# Patient Record
Sex: Female | Born: 1960 | Race: White | Hispanic: No | Marital: Married | Smoking: Former smoker
Health system: Southern US, Community
[De-identification: ages and names within clinical notes are randomized; demographics above are authoritative.]

## PROBLEM LIST (undated history)

## (undated) DIAGNOSIS — I10 Essential (primary) hypertension: Secondary | ICD-10-CM

## (undated) DIAGNOSIS — E119 Type 2 diabetes mellitus without complications: Secondary | ICD-10-CM

## (undated) DIAGNOSIS — E785 Hyperlipidemia, unspecified: Secondary | ICD-10-CM

## (undated) DIAGNOSIS — O00109 Unspecified tubal pregnancy without intrauterine pregnancy: Secondary | ICD-10-CM

## (undated) DIAGNOSIS — F419 Anxiety disorder, unspecified: Secondary | ICD-10-CM

## (undated) DIAGNOSIS — N840 Polyp of corpus uteri: Secondary | ICD-10-CM

## (undated) DIAGNOSIS — N920 Excessive and frequent menstruation with regular cycle: Secondary | ICD-10-CM

## (undated) HISTORY — DX: Anxiety disorder, unspecified: F41.9

## (undated) HISTORY — DX: Hyperlipidemia, unspecified: E78.5

## (undated) HISTORY — DX: Unspecified tubal pregnancy without intrauterine pregnancy: O00.109

## (undated) HISTORY — PX: OTHER SURGICAL HISTORY: SHX169

## (undated) HISTORY — DX: Polyp of corpus uteri: N84.0

## (undated) HISTORY — DX: Essential (primary) hypertension: I10

## (undated) HISTORY — DX: Excessive and frequent menstruation with regular cycle: N92.0

## (undated) HISTORY — DX: Type 2 diabetes mellitus without complications: E11.9

## (undated) HISTORY — PX: DILATION AND CURETTAGE OF UTERUS: SHX78

---

## 1998-02-26 ENCOUNTER — Ambulatory Visit (HOSPITAL_COMMUNITY): Admission: RE | Admit: 1998-02-26 | Discharge: 1998-02-26 | Payer: Self-pay | Admitting: Obstetrics and Gynecology

## 1998-11-20 ENCOUNTER — Encounter: Payer: Self-pay | Admitting: Internal Medicine

## 1998-11-20 ENCOUNTER — Ambulatory Visit (HOSPITAL_COMMUNITY): Admission: RE | Admit: 1998-11-20 | Discharge: 1998-11-20 | Payer: Self-pay | Admitting: Internal Medicine

## 1999-07-10 ENCOUNTER — Other Ambulatory Visit: Admission: RE | Admit: 1999-07-10 | Discharge: 1999-07-10 | Payer: Self-pay | Admitting: Obstetrics and Gynecology

## 2000-12-04 ENCOUNTER — Encounter (INDEPENDENT_AMBULATORY_CARE_PROVIDER_SITE_OTHER): Payer: Self-pay

## 2000-12-04 ENCOUNTER — Other Ambulatory Visit
Admission: RE | Admit: 2000-12-04 | Discharge: 2000-12-04 | Payer: Self-pay | Admitting: Physical Medicine & Rehabilitation

## 2001-04-07 ENCOUNTER — Encounter: Payer: Self-pay | Admitting: Obstetrics and Gynecology

## 2001-04-07 ENCOUNTER — Encounter: Admission: RE | Admit: 2001-04-07 | Discharge: 2001-04-07 | Payer: Self-pay | Admitting: Obstetrics and Gynecology

## 2001-04-28 ENCOUNTER — Encounter: Admission: RE | Admit: 2001-04-28 | Discharge: 2001-04-28 | Payer: Self-pay | Admitting: Obstetrics and Gynecology

## 2001-04-28 ENCOUNTER — Encounter: Payer: Self-pay | Admitting: Obstetrics and Gynecology

## 2001-05-11 ENCOUNTER — Encounter: Admission: RE | Admit: 2001-05-11 | Discharge: 2001-05-11 | Payer: Self-pay | Admitting: Internal Medicine

## 2001-05-11 ENCOUNTER — Encounter: Payer: Self-pay | Admitting: Internal Medicine

## 2001-05-26 ENCOUNTER — Ambulatory Visit (HOSPITAL_COMMUNITY): Admission: RE | Admit: 2001-05-26 | Discharge: 2001-05-26 | Payer: Self-pay | Admitting: Obstetrics and Gynecology

## 2001-05-26 ENCOUNTER — Encounter (INDEPENDENT_AMBULATORY_CARE_PROVIDER_SITE_OTHER): Payer: Self-pay

## 2001-08-05 ENCOUNTER — Encounter: Admission: RE | Admit: 2001-08-05 | Discharge: 2001-08-05 | Payer: Self-pay | Admitting: Obstetrics and Gynecology

## 2001-08-05 ENCOUNTER — Encounter: Payer: Self-pay | Admitting: Obstetrics and Gynecology

## 2001-11-02 ENCOUNTER — Encounter: Payer: Self-pay | Admitting: Obstetrics and Gynecology

## 2001-11-02 ENCOUNTER — Encounter: Admission: RE | Admit: 2001-11-02 | Discharge: 2001-11-02 | Payer: Self-pay | Admitting: Obstetrics and Gynecology

## 2001-11-03 ENCOUNTER — Encounter: Admission: RE | Admit: 2001-11-03 | Discharge: 2001-11-03 | Payer: Self-pay | Admitting: Internal Medicine

## 2001-11-03 ENCOUNTER — Encounter: Payer: Self-pay | Admitting: Internal Medicine

## 2001-11-16 ENCOUNTER — Encounter: Admission: RE | Admit: 2001-11-16 | Discharge: 2001-11-16 | Payer: Self-pay | Admitting: Internal Medicine

## 2001-11-16 ENCOUNTER — Encounter: Payer: Self-pay | Admitting: Internal Medicine

## 2001-11-24 ENCOUNTER — Emergency Department (HOSPITAL_COMMUNITY): Admission: EM | Admit: 2001-11-24 | Discharge: 2001-11-25 | Payer: Self-pay | Admitting: *Deleted

## 2001-11-29 ENCOUNTER — Ambulatory Visit (HOSPITAL_COMMUNITY): Admission: RE | Admit: 2001-11-29 | Discharge: 2001-11-29 | Payer: Self-pay | Admitting: Obstetrics and Gynecology

## 2001-11-29 ENCOUNTER — Encounter: Payer: Self-pay | Admitting: Obstetrics and Gynecology

## 2002-01-03 ENCOUNTER — Encounter: Payer: Self-pay | Admitting: Obstetrics and Gynecology

## 2002-01-03 ENCOUNTER — Encounter: Admission: RE | Admit: 2002-01-03 | Discharge: 2002-01-03 | Payer: Self-pay | Admitting: Obstetrics and Gynecology

## 2002-10-18 ENCOUNTER — Other Ambulatory Visit: Admission: RE | Admit: 2002-10-18 | Discharge: 2002-10-18 | Payer: Self-pay | Admitting: Obstetrics and Gynecology

## 2002-11-08 ENCOUNTER — Ambulatory Visit (HOSPITAL_BASED_OUTPATIENT_CLINIC_OR_DEPARTMENT_OTHER): Admission: RE | Admit: 2002-11-08 | Discharge: 2002-11-08 | Payer: Self-pay | Admitting: Obstetrics and Gynecology

## 2002-11-08 ENCOUNTER — Encounter (INDEPENDENT_AMBULATORY_CARE_PROVIDER_SITE_OTHER): Payer: Self-pay

## 2002-12-01 ENCOUNTER — Encounter: Payer: Self-pay | Admitting: Obstetrics and Gynecology

## 2002-12-01 ENCOUNTER — Encounter: Admission: RE | Admit: 2002-12-01 | Discharge: 2002-12-01 | Payer: Self-pay | Admitting: Obstetrics and Gynecology

## 2003-04-27 ENCOUNTER — Encounter: Payer: Self-pay | Admitting: Internal Medicine

## 2003-04-27 ENCOUNTER — Encounter: Admission: RE | Admit: 2003-04-27 | Discharge: 2003-04-27 | Payer: Self-pay | Admitting: Internal Medicine

## 2004-04-09 ENCOUNTER — Other Ambulatory Visit: Admission: RE | Admit: 2004-04-09 | Discharge: 2004-04-09 | Payer: Self-pay | Admitting: *Deleted

## 2004-04-11 ENCOUNTER — Encounter: Admission: RE | Admit: 2004-04-11 | Discharge: 2004-04-11 | Payer: Self-pay | Admitting: Obstetrics and Gynecology

## 2004-05-16 ENCOUNTER — Encounter: Admission: RE | Admit: 2004-05-16 | Discharge: 2004-05-16 | Payer: Self-pay | Admitting: Internal Medicine

## 2005-06-20 ENCOUNTER — Encounter: Admission: RE | Admit: 2005-06-20 | Discharge: 2005-06-20 | Payer: Self-pay | Admitting: Internal Medicine

## 2005-07-17 ENCOUNTER — Ambulatory Visit (HOSPITAL_COMMUNITY): Admission: RE | Admit: 2005-07-17 | Discharge: 2005-07-17 | Payer: Self-pay | Admitting: Gastroenterology

## 2005-07-24 ENCOUNTER — Other Ambulatory Visit: Admission: RE | Admit: 2005-07-24 | Discharge: 2005-07-24 | Payer: Self-pay | Admitting: Obstetrics and Gynecology

## 2005-07-30 ENCOUNTER — Encounter: Admission: RE | Admit: 2005-07-30 | Discharge: 2005-07-30 | Payer: Self-pay | Admitting: Internal Medicine

## 2005-10-07 ENCOUNTER — Encounter: Admission: RE | Admit: 2005-10-07 | Discharge: 2005-10-07 | Payer: Self-pay | Admitting: Gastroenterology

## 2006-04-04 HISTORY — PX: LAPAROSCOPIC CHOLECYSTECTOMY: SUR755

## 2006-04-21 ENCOUNTER — Ambulatory Visit (HOSPITAL_COMMUNITY): Admission: RE | Admit: 2006-04-21 | Discharge: 2006-04-21 | Payer: Self-pay | Admitting: Surgery

## 2006-04-21 ENCOUNTER — Encounter (INDEPENDENT_AMBULATORY_CARE_PROVIDER_SITE_OTHER): Payer: Self-pay | Admitting: *Deleted

## 2006-08-19 ENCOUNTER — Encounter: Admission: RE | Admit: 2006-08-19 | Discharge: 2006-08-19 | Payer: Self-pay | Admitting: Internal Medicine

## 2006-09-01 ENCOUNTER — Encounter: Admission: RE | Admit: 2006-09-01 | Discharge: 2006-09-01 | Payer: Self-pay | Admitting: Internal Medicine

## 2006-09-30 ENCOUNTER — Other Ambulatory Visit: Admission: RE | Admit: 2006-09-30 | Discharge: 2006-09-30 | Payer: Self-pay | Admitting: Obstetrics & Gynecology

## 2007-01-01 IMAGING — RF DG CHOLANGIOGRAM OPERATIVE
1 series · 4 of 4 positions shown · non-contrast
Comparison: none

CLINICAL DATA: Biliary dyskinesia.  
 OPERATIVE CHOLANGIOGRAM:
 50 C-arm fluoroscopic guided spot images.  Contrast injection of the cystic duct led to excellent opacification of the biliary ducts.  No biliary ductal stricture or dilatation.  Vague oval filling defect is initially noted in the common hepatic duct on image #28 and that is noted to move superiorly into the main left hepatic duct.  I feel that this represents an air bubble.  No findings to strongly suggest ductal stones.

[Series 1: run · 4 of 50 frames shown]
[frame 2/50]
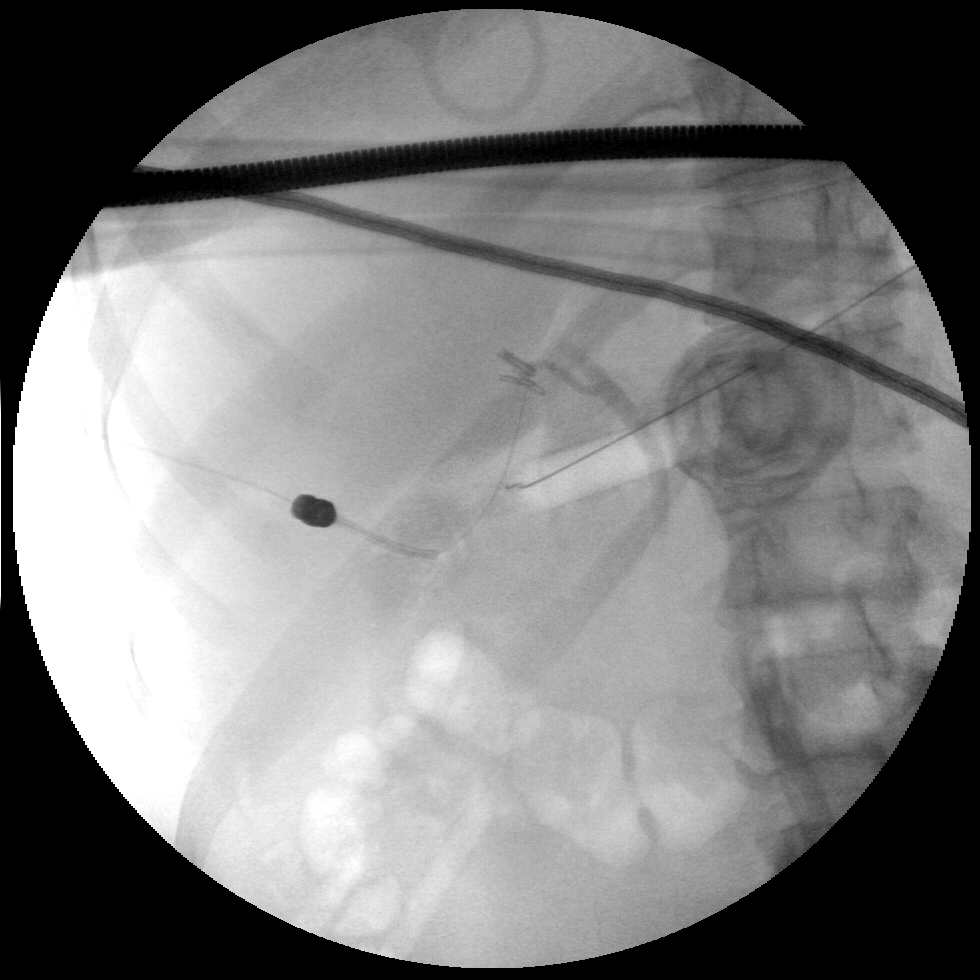
[frame 8/50]
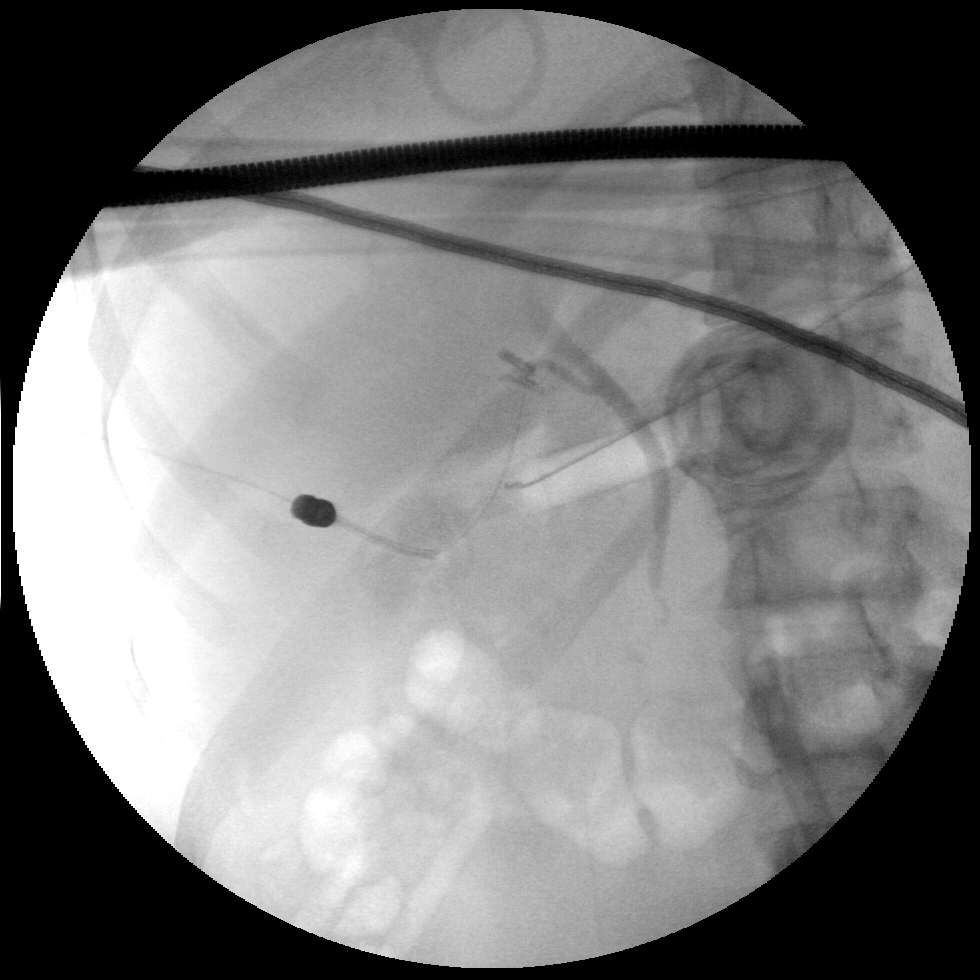
[frame 26/50]
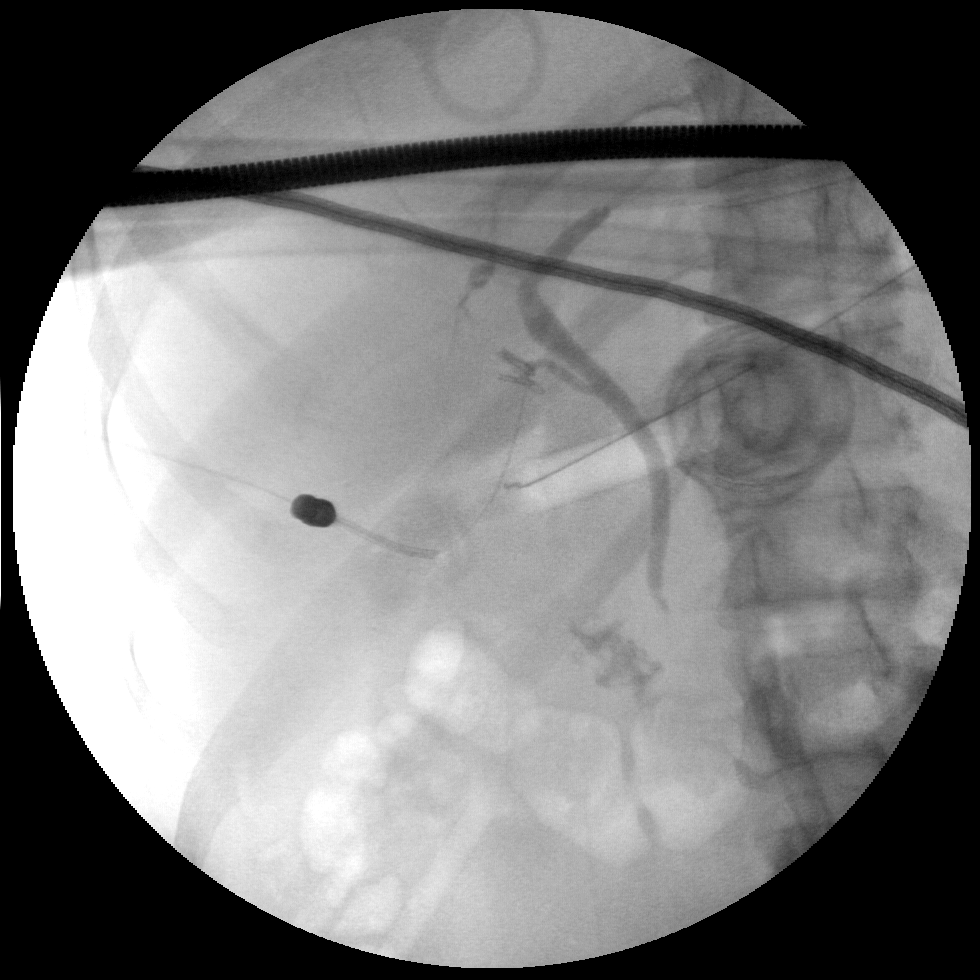
[frame 43/50]
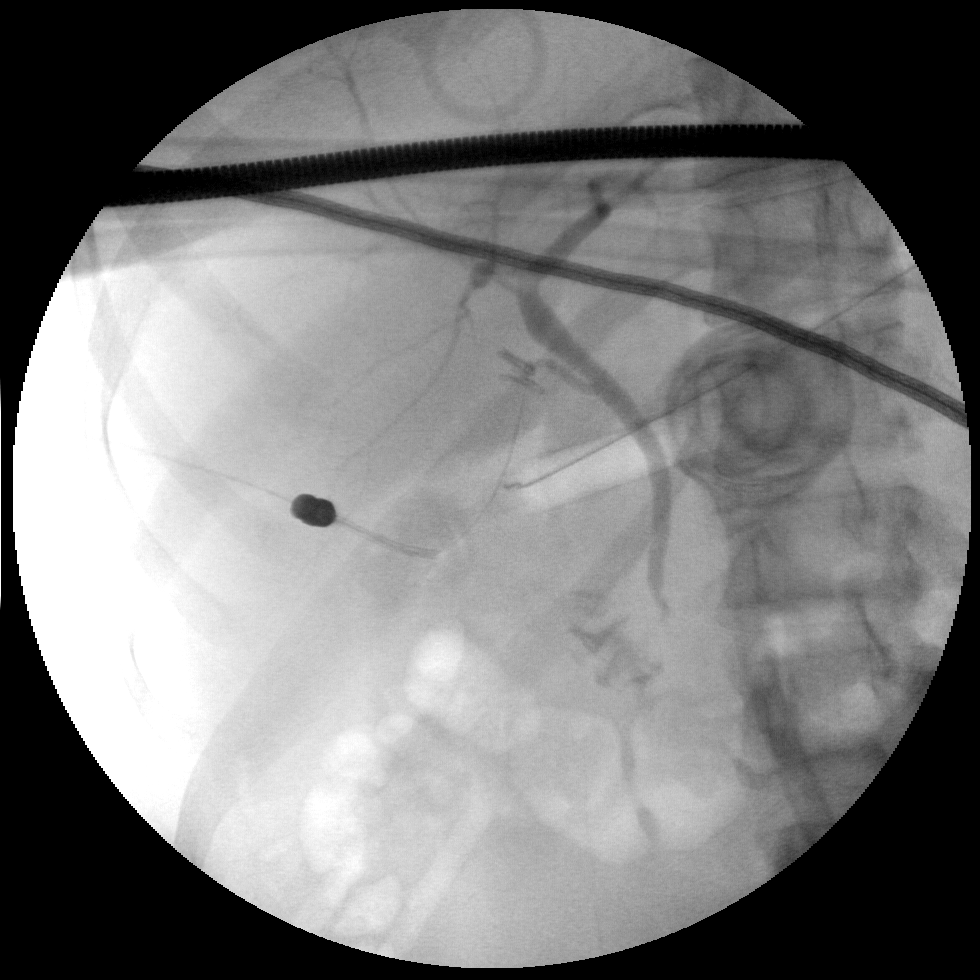

[4 of 4 positions shown; findings below may reference images not displayed]

IMPRESSION: Negative operative cholangiogram.

## 2007-09-06 ENCOUNTER — Encounter: Admission: RE | Admit: 2007-09-06 | Discharge: 2007-09-06 | Payer: Self-pay | Admitting: Internal Medicine

## 2007-11-03 ENCOUNTER — Other Ambulatory Visit: Admission: RE | Admit: 2007-11-03 | Discharge: 2007-11-03 | Payer: Self-pay | Admitting: Obstetrics and Gynecology

## 2008-10-03 ENCOUNTER — Encounter: Admission: RE | Admit: 2008-10-03 | Discharge: 2008-10-03 | Payer: Self-pay | Admitting: Internal Medicine

## 2010-01-28 ENCOUNTER — Encounter: Admission: RE | Admit: 2010-01-28 | Discharge: 2010-01-28 | Payer: Self-pay | Admitting: Internal Medicine

## 2010-08-25 ENCOUNTER — Encounter: Payer: Self-pay | Admitting: Internal Medicine

## 2010-12-20 NOTE — H&P (Signed)
[facility]  Patient:    Tina [name], Tina [name] Visit Number: [id number] MRN: [id number]          Service Type: DSU Location: [facility] Attending Physician:  Madelyn Flavors Dictated by:   Beather Arbour Thomasena Edis, M.D. Admit Date:  05/26/2001   CC:         [facility], [place], [place] [address]   History and Physical  ATTENDING NOT GIVEN  HISTORY OF PRESENT ILLNESS:   The patient is a 50 year old Caucasian female who presented complaining of menorrhagia with large clots in mid July.  She subsequently underwent a sonohysterogram but was having her menstrual period and we were unable to ascertain with certainty the presence or absence of an endometrial polyp.  She subsequently underwent a repeat sonohysterogram which did show an endometrial polyp.  She was counseled to undergo D&C hysteroscopy. Risks of surgery including anesthetic complication, hemorrhage, infection, damage to adjacent structures including bladder, bowel, blood vessels, or ureter were discussed with the patient.  It should be noted that the polyp was noted to be in the posterior fundal region.  The patient was also made aware of the risk of uterine perforation, which could result in overwhelming life-threatening hemorrhage requiring emergent hysterectomy or uterine perforation resulting in bowel damage, which could require emergent colostomy or result in overwhemling life-threatening peritonitis.  She expressed understanding of and acceptance of these risks, and thus she is admitted for [facility] hysteroscopy and polypectomy.  PAST MEDICAL HISTORY:         The patient underwent a D&E for a missed abortion in July 1999.  Depression and history of hypertension.  The patient has a history of an ectopic pregnancy in 1993.  MEDICATIONS:                  Diovan 160 mg daily.  FAMILY HISTORY:               There is no family history of colon, breast, ovarian, or prostate  cancer.  ALLERGIES:                    No known drug allergies.  SOCIAL HISTORY:               The patient works for [employer] as a Occupational psychologist.  REVIEW OF SYSTEMS:            Denies chest pain, shortness of breath, dizziness, scotomata, abdominal pain, unintentional weight loss, hematochezia, dysuria, urgency, or frequency.  PHYSICAL EXAMINATION:  VITAL SIGNS:                  Blood pressure 142/90, weight 172 pounds, temperature 97.2.  LMP May 04, 2001.  HEENT:                        Normal.  NECK:                         Supple without thyromegaly.  LUNGS:                        Clear to auscultation.  CARDIAC:                      Regular rate and rhythm.  ABDOMEN:  Soft, nontender.  No hepatosplenomegaly.  PELVIC:                       BUS normal.  Vagina normal.  Pap smear performed October 05, 2000 was within normal limits.  Uterus in the position normal mobile without any adnexal mass palpated.  Rectal is confirmatory.  ASSESSMENT AND PLAN:          The patient is a 50 year old Caucasian female, gravida 2, para 0 with a possible posterior endometrial polyp and a history of significant menorrhagia admitted for a dilation and curettage hysteroscopy and polypectomy.  Risks have been explained to the patient and she expresses understanding of and acceptance of these risks. Dictated by:   Beather Arbour Thomasena Edis, M.D. Attending Physician:  Madelyn Flavors DD:  05/26/01 TD:  05/26/01 Job: [id number]

## 2010-12-20 NOTE — Op Note (Signed)
Tina [name], Tina [name]                            ACCOUNT NO.:  [id number]   MEDICAL RECORD NO.:  [id number]                   PATIENT TYPE:  AMB   LOCATION:  [facility]                                 FACILITY:  [facility]   PHYSICIAN:  Laqueta Linden, M.D.                 DATE OF BIRTH:  1961/07/08   DATE OF PROCEDURE:  11/08/2002  DATE OF DISCHARGE:                                 OPERATIVE REPORT   PREOPERATIVE DIAGNOSES:  Abnormal uterine bleeding due to endometrial polyp.   POSTOPERATIVE DIAGNOSES:  Abnormal uterine bleeding due to endometrial  polyp.   PROCEDURE:  Hysteroscopic resection with rollerball ablation.   SURGEON:  Laqueta Linden, M.D.   ANESTHESIA:  MAC sedation with paracervical block.   ESTIMATED BLOOD LOSS:  Less than 10 mL.   SORBITOL NET INTAKE:  20 mL.   COMPLICATIONS:  None.   INDICATIONS FOR PROCEDURE:  Tina [name] is a 50 year old, gravida 3, para 67  female who presented with worsening menorrhagia and intermenstrual bleeding.  She had a history of an endometrial polyp several years previously. She  underwent pelvic ultrasound with sonohysterogram confirming a normal size  uterus with a broad based posterior endometrial polyp measuring 10 x 7 x 6  mm. It was felt that she had a large endometrial polyp as a likely source of  her bleeding. She does not desire further pregnancy. She was offered  alternatives including ablation, resection, and possible placement of a  Jearld Adjutant IUD versus low dose birth control pills postoperatively to control  age related cycle changes. She elected to proceed to elective resection  first. She saw the informed consent film, voiced her understanding and  acceptance of all the risks and agreed to proceed. Her last menstrual period  was November 03, 2002 and she had a negative urine pregnancy test the morning of  surgery.   DESCRIPTION OF PROCEDURE:  The patient was taken to the operating room and  after proper identification and consents  were ascertained, she was placed on  the operating table in supine position. IV sedation was accomplished and she  was placed in the [place] stirrups and the perineum and vagina prepped and  draped in a routine sterile fashion. A transurethral Foley was placed which  was removed at the conclusion of the procedure. Bimanual examination  confirmed an anterior normal size normal uterus. The speculum was placed and  the cervix grasped with a single tooth tenaculum. A paracervical block  utilizing 10 mL of 1% plain Xylocaine then placed circumferentially. The  endometrial cavity sounded to 8 cm. The internal os was gently dilated to a  #33 Pratt dilator. The resectoscope was then inserted under direct vision.  The endocervical canal was free of lesions. There was a small polyp in the  left cornual region. There was a large posterior polyp obscuring virtually  the entire posterior wall of  the uterus. The single loop was then placed on  routine settings and the posterior polyp was resected. Several small  bleeding points were cauterized. The cornual polyp was difficult to access  due to its location. For this reason and because the patient has had so much  trouble with bleeding, a rollerball ablation was performed. The rollerball  was placed on routine settings and the endometrial cavity was then  systematically oblated including careful ablation in the left cornual region  which did remove the polyp. Photographs were taken before resection and  ablation and after for confirmation of surgical findings. At the end of the  procedure, the entire endometrial cavity appeared to be blanched and  ablated. The resectoscope was removed under direct vision. The tenaculum  site was hemostatic. There was no active bleeding from the cervix. All  instruments were removed and the Foley was removed. Estimated blood loss  less than 10 mL. Sorbitol net intake 20 mL. Complications none.   The patient will be observed  and discharged per anesthesia protocol. She  will be seen in the office in 2-3 weeks time. She will be given routine  verbal and written discharge instructions and told to followup in the office  for extensive pain, fever, bleeding or other concerns prior to scheduled  followup. She is to take Advil or Aleve for any cramping and did receive  Toradol 30 mg IV and 30 mg IM at the initiation of the procedure.                                               Laqueta Linden, M.D.    LKS/MEDQ  D:  11/08/2002  T:  11/08/2002  Job:  [id number]

## 2010-12-20 NOTE — Op Note (Signed)
[facility]  Patient:    Tina [name], Tina [name] Visit Number: [id number] MRN: [id number]          Service Type: DSU Location: [facility] Attending Physician:  Madelyn Flavors Dictated by:   Beather Arbour Thomasena Edis, M.D. Proc. Date: 05/26/01 Admit Date:  05/26/2001   CC:         Pearla Dubonnet, M.D.   Operative Report  PREOPERATIVE DIAGNOSES:       Menorrhagia, endometrial polyp.  POSTOPERATIVE DIAGNOSES:      Menorrhagia, endometrial polyp.  PROCEDURE:                    Dilatation and curettage, attempted hysteroscopy, polypectomy.  ANESTHESIA:                   Monitored anesthesia care plus 10 cc 1% lidocaine paracervical block.  SURGEON:                      Beather Arbour. Thomasena Edis, M.D.  ESTIMATED BLOOD LOSS:         Minimal.  DRAINS:                       None.  COMPLICATIONS:                None.  DESCRIPTION OF OPERATION:     Patient was brought to the operating room, identified on the operating room table.  After the patient was adequately sedated she was placed in the dorsal lithotomy position and prepped and draped in the usual sterile fashion.  The bladder was catheterized for approximately 50 cc of clear yellow urine.  An examination under anesthesia revealed the uterus to be anteverted and approximately 6 weeks size and mobile without any adnexal mass palpated.  A speculum was placed and the anterior lip of the cervix was infiltrated with 1 cc of 1% lidocaine and grasped with a single tooth tenaculum.  The remaining 9 cc of 1% lidocaine were infused for a paracervical block.  The cervix was very carefully and gently dilated.  I was able to dilate easily the cervix to a 21 Pratt dilator and did attempt to dilate to a 23 Pratt dilator several times.  Without using undue force I was unable to dilate the cervix to a 23 Pratt dilator.  I did place the [facility] hysteroscope into the external os in hopes to navigate the internal os. However, this  could not be accomplished.  Again, I did attempt to dilate the cervix to a #23 Pratt dilator and again was unable to do so.  After several attempts it was deemed in the best interest of the patient to decrease the risk of uterine perforation to abort the attempt at the hysteroscopy as I was unable to dilate up to a #23 Pratt dilator to accommodate the [facility] hysteroscope.  The serrated curette was placed and the uterus was systematically curetted in a systematic clockwise fashion with tissue obtained.  The polyp was obtained with the first pass.  The ______ Stone forceps were placed and additional tissue was obtained.  After the uterus was curetted in a systematic clockwise fashion such that a good cry was heard all around, the procedure was then terminated.  The patient tolerated procedure well without apparent complications and was transferred to the recovery room in stable condition after all instrument, sponge, and needle counts were correct.  She did receive Toradol for postoperative  analgesia.  There was noted to be minimal bleeding from the uterus and no bleeding from the tenaculum site.  The patient did tolerate the procedure well without any apparent complications.  She was given the postoperative D&C sheet, urged to call the office for a postoperative appointment in two to three weeks, and to refrain from anything in her vagina.  She is to call for heavy bleeding or any problems.  She is also to take ibuprofen 400-600 mg q.6h. p.r.n. for pain. Dictated by:   Beather Arbour Thomasena Edis, M.D. Attending Physician:  Madelyn Flavors DD:  05/26/01 TD:  05/27/01 Job: [id number]

## 2010-12-20 NOTE — Op Note (Signed)
[name], [name]                  ACCOUNT NO.:  [id number]   MEDICAL RECORD NO.:  [id number]          PATIENT TYPE:  AMB   LOCATION:  DAY                          FACILITY:  [facility]   PHYSICIAN:  Thornton Park. Daphine Deutscher, MD  DATE OF BIRTH:  [birth year]   DATE OF PROCEDURE:  04/21/2006  DATE OF DISCHARGE:                                 OPERATIVE REPORT   PREOPERATIVE DIAGNOSIS:  Biliary dyskinesia.   POSTOPERATIVE DIAGNOSIS:  Biliary dyskinesia.   PROCEDURE:  Laparoscopic cholecystectomy, intraoperative cholangiogram  showing a small cystic duct, small common duct with intrahepatic filling and  free flow into the duodenum.   SURGEON:  Daphine Deutscher, M.D.   Threasa HeadsColin Benton, M.D.   ANESTHESIA:  General.   DESCRIPTION OF PROCEDURE:  [name] was taken to Room 1 on April 21, 2006 and given general anesthesia.  The abdomen was prepped with  chlorhexidine and draped sterilely.  Longitudinal incision was made under  the umbilicus, and I used a Hassan technique and entered the abdomen without  difficulty.  Upper trocars were placed without difficulty.  The gallbladder  was grasped, elevated and Calot's triangle was dissected using the hook  electrocautery.  Clips were placed on the cystic artery and proximally on  the cystic duct at the junction with the gallbladder.  The cystic duct was  incised, and a Reddick catheter was inserted although it was a very small  duct.  This showed fairly reasonably normal in length cystic duct with flow  into the common bile duct which was not dilated and free flow into the  duodenum and with good intrahepatic filling as well.  Cystic duct was then  triple clipped, divided; the cystic artery double clipped, divided; and the  gallbladder was removed without entering it using the hook electrocautery.  It was somewhat intrahepatic.  In looking through the wall, you could see  cholesterol deposits suggestive of cholesterolosis.  Once detached, I  cauterized the  liver fundus where the gallbladder fundus was and then  controlled bleeding.  No bile leaks were noted.  The gallbladder was put in  a bag and brought out through the umbilicus.  The umbilical defect was  repaired with 3 sutures of 0 Vicryl under laparoscopic vision.  I  then deflated the abdomen well and then injected Marcaine into the wounds  and closed skin with 4-0 Vicryl.  Benzoin and Steri-Strips used.  The  patient tolerated the procedure well and was taken to recovery room in  satisfactory condition.      Thornton Park Daphine Deutscher, MD  Electronically Signed     MBM/MEDQ  D:  04/21/2006  T:  04/22/2006  Job:  [id number]   cc:   Fayrene Fearing L. Malon Kindle., M.D.  Fax: [phone]   Candyce Churn, M.D.  Fax: [phone]

## 2011-01-29 ENCOUNTER — Other Ambulatory Visit: Payer: Self-pay | Admitting: Internal Medicine

## 2011-01-29 DIAGNOSIS — Z1231 Encounter for screening mammogram for malignant neoplasm of breast: Secondary | ICD-10-CM

## 2011-02-03 ENCOUNTER — Ambulatory Visit
Admission: RE | Admit: 2011-02-03 | Discharge: 2011-02-03 | Disposition: A | Discharge status: Home / Self Care | Payer: 59 | Source: Ambulatory Visit | Attending: Internal Medicine | Admitting: Internal Medicine

## 2011-02-03 DIAGNOSIS — Z1231 Encounter for screening mammogram for malignant neoplasm of breast: Secondary | ICD-10-CM

## 2012-02-26 ENCOUNTER — Other Ambulatory Visit: Payer: Self-pay | Admitting: Internal Medicine

## 2012-02-26 DIAGNOSIS — Z1231 Encounter for screening mammogram for malignant neoplasm of breast: Secondary | ICD-10-CM

## 2012-03-08 ENCOUNTER — Ambulatory Visit
Admission: RE | Admit: 2012-03-08 | Discharge: 2012-03-08 | Disposition: A | Discharge status: Home / Self Care | Payer: 59 | Source: Ambulatory Visit | Attending: Internal Medicine | Admitting: Internal Medicine

## 2012-03-08 DIAGNOSIS — Z1231 Encounter for screening mammogram for malignant neoplasm of breast: Secondary | ICD-10-CM

## 2012-10-20 ENCOUNTER — Other Ambulatory Visit: Payer: Self-pay

## 2012-10-20 MED ORDER — MEDROXYPROGESTERONE ACETATE 10 MG PO TABS: 10.0000 mg | ORAL_TABLET | Freq: Every day | ORAL | Status: DC

## 2012-11-09 ENCOUNTER — Telehealth: Payer: Self-pay | Admitting: Certified Nurse Midwife

## 2012-11-09 NOTE — Telephone Encounter (Signed)
I did not call this patient.

## 2012-11-09 NOTE — Telephone Encounter (Signed)
I didn't call this patient.

## 2012-11-09 NOTE — Telephone Encounter (Signed)
LEFT MESSAGE ON CB# TO RETURN OUR CALL . SUE

## 2013-02-14 ENCOUNTER — Other Ambulatory Visit: Payer: Self-pay

## 2013-02-14 DIAGNOSIS — Z1231 Encounter for screening mammogram for malignant neoplasm of breast: Secondary | ICD-10-CM

## 2013-03-14 ENCOUNTER — Ambulatory Visit
Admission: RE | Admit: 2013-03-14 | Discharge: 2013-03-14 | Disposition: A | Discharge status: Home / Self Care | Payer: 59 | Source: Ambulatory Visit

## 2013-03-14 DIAGNOSIS — Z1231 Encounter for screening mammogram for malignant neoplasm of breast: Secondary | ICD-10-CM

## 2013-10-11 ENCOUNTER — Encounter: Payer: Self-pay | Admitting: Certified Nurse Midwife

## 2013-10-13 ENCOUNTER — Ambulatory Visit (INDEPENDENT_AMBULATORY_CARE_PROVIDER_SITE_OTHER): Payer: BC Managed Care – PPO | Admitting: Certified Nurse Midwife

## 2013-10-13 ENCOUNTER — Encounter: Payer: Self-pay | Admitting: Certified Nurse Midwife

## 2013-10-13 VITALS — BP 136/87 | HR 76 | Resp 16 | Ht 61.75 in | Wt 196.0 lb

## 2013-10-13 DIAGNOSIS — N390 Urinary tract infection, site not specified: Secondary | ICD-10-CM

## 2013-10-13 DIAGNOSIS — I10 Essential (primary) hypertension: Secondary | ICD-10-CM | POA: Insufficient documentation

## 2013-10-13 DIAGNOSIS — R81 Glycosuria: Secondary | ICD-10-CM | POA: Insufficient documentation

## 2013-10-13 DIAGNOSIS — Z0000 Encounter for general adult medical examination without abnormal findings: Secondary | ICD-10-CM

## 2013-10-13 DIAGNOSIS — Z01419 Encounter for gynecological examination (general) (routine) without abnormal findings: Secondary | ICD-10-CM

## 2013-10-13 LAB — POCT URINALYSIS DIPSTICK
Bilirubin, UA: NEGATIVE
Blood, UA: 50
Glucose, UA: 50
Ketones, UA: NEGATIVE
Leukocytes, UA: NEGATIVE
Nitrite, UA: NEGATIVE
Protein, UA: NEGATIVE
Urobilinogen, UA: NEGATIVE
pH, UA: 5

## 2013-10-13 LAB — GLUCOSE, RANDOM: Glucose, Bld: 201 mg/dL — ABNORMAL HIGH (ref 70–99)

## 2013-10-13 NOTE — Patient Instructions (Signed)

## 2013-10-13 NOTE — Progress Notes (Signed)
53 y.o. [id number] Married Caucasian Fe here for annual exam. Menopausal no HRT, denies vaginal bleeding, hot flashes or night sweats.. Patient having some vaginal dryness, using OTC with relief. Denies UTI symptoms of urgency, frequency or pain. Seen 2 days ago for aex with PCP and was told glucose slightly elevated and plan was weight loss/monitoring. Patient has not noted in blood in urine. No fever or chills or back pain. Patient ate mainly carbohydrates for early morning meal. No other health issues today.  Patient's last menstrual period was 05/04/2012.          Sexually active: yes  The current method of family planning is condoms most of the time and post menopausal status.    Exercising: no   Smoker:  No quit 47 years ago  Health Maintenance: Pap:  10/12/12 Neg   HRHPV Testing Neg MMG:  02/14/13 Neg (TBC) Colonoscopy: 2011 normal, repeat in 10 years BMD:  never TDaP:  2013 (Dr. Marcellus Scott) Labs:  Hgb  15.2    U/A  Ph 5.0  Glucose 50   Blood 50   reports that she quit smoking about 47 years ago. She has never used smokeless tobacco. She reports that she drinks about 2.0 ounces of alcohol per week. She reports that she does not use illicit drugs.  Past Medical History  Diagnosis Date  . Hypertension   . Menorrhagia   . Endometrial polyp   . Hyperlipidemia   . Anxiety   . Tubal pregnancy     Past Surgical History  Procedure Laterality Date  . Hysteroscopic resection      /rollerball ablation  . Laparoscopic cholecystectomy  9/07  . Dilation and curettage of uterus      Current Outpatient Prescriptions  Medication Sig Dispense Refill  . eprosartan-hydrochlorothiazide (TEVETEN HCT) 600-12.5 MG per tablet Take 1 tablet by mouth daily.      Marland Kitchen LORazepam (ATIVAN) 0.5 MG tablet       . metoprolol succinate (TOPROL-XL) 50 MG 24 hr tablet Take 50 mg by mouth daily. Take with or immediately following a meal.      . olmesartan (BENICAR) 40 MG tablet Take 40 mg by mouth daily.      .  rosuvastatin (CRESTOR) 5 MG tablet Take 5 mg by mouth daily.      . medroxyPROGESTERone (PROVERA) 10 MG tablet Take 1 tablet (10 mg total) by mouth daily.  10 tablet  0   No current facility-administered medications for this visit.    Family History  Problem Relation Age of Onset  . Hypertension Mother   . Heart disease Mother   . Heart attack Mother   . Hypertension Father   . Heart disease Father   . Heart attack Father     ROS:  Pertinent items are noted in HPI.  Otherwise, a comprehensive ROS was negative.  Exam:   BP 136/87  Pulse 76  Resp 16  Ht 5' 1.75" (1.568 m)  Wt 196 lb (88.905 kg)  BMI 36.16 kg/m2  LMP 05/04/2012 Height: 5' 1.75" (156.8 cm)  Ht Readings from Last 3 Encounters:  10/13/13 5' 1.75" (1.568 m)    General appearance: alert, cooperative and appears stated age Skin: warm and dry Head: Normocephalic, without obvious abnormality, atraumatic Neck: no adenopathy, supple, symmetrical, trachea midline and thyroid normal to inspection and palpation and non-palpable Lungs: clear to auscultation bilaterally CVAT negative bilateral Breasts: normal appearance, no masses or tenderness, No nipple retraction or dimpling, No nipple discharge  or bleeding, No axillary or supraclavicular adenopathy Heart: regular rate and rhythm Abdomen: soft, non-tender; no masses,  no organomegaly, negative suprapubic Extremities: extremities normal, atraumatic, no cyanosis or edema Skin: Skin color, texture, turgor normal. No rashes or lesions Lymph nodes: Cervical, supraclavicular, and axillary nodes normal. No abnormal inguinal nodes palpated Neurologic: Grossly normal   Pelvic: External genitalia:  no lesions              Urethra:  normal appearing urethra with no masses, tenderness or lesions, bladder or urethral meatus non tender              Bartholin's and Skene's: normal                 Vagina: normal appearing vagina with normal color and discharge, no lesions               Cervix: normal, non tender              Pap taken: no Bimanual Exam:  Uterus:  normal size, contour, position, consistency, mobility, non-tender and mid position              Adnexa: normal adnexa and no mass, fullness, tenderness               Rectovaginal: Confirms               Anus:  normal sphincter tone, no lesions  A:  Well Woman with normal exam   Menopausal no HRT  Glucosuria with recent diagnosis of glucose concern with PCP appointment with follow up planned  Hematuria asymptomatic R/O UTI  Hypertension stable medication with PCP management  P:   Reviewed health and wellness pertinent to exam  Aware of need to evaluate if vaginal bleeding  Lab: serum glucose, will have patient follow up with PCP after lab in  Lab: Urine micro/culture. Increase water intake and decrease soda use. Patient aware of UTI warning signs  Pap smear as per guidelines   Mammogram yearly pap smear not taken today  counseled on breast self exam, mammography screening, adequate intake of calcium and vitamin D, diet and exercise  return annually or prn  An After Visit Summary was printed and given to the patient.

## 2013-10-14 ENCOUNTER — Telehealth: Payer: Self-pay | Admitting: Emergency Medicine

## 2013-10-14 LAB — URINALYSIS, MICROSCOPIC ONLY
Bacteria, UA: NONE SEEN
CASTS: NONE SEEN
CRYSTALS: NONE SEEN
Squamous Epithelial / HPF: NONE SEEN

## 2013-10-14 LAB — HEMOGLOBIN, FINGERSTICK: Hemoglobin, fingerstick: 15.2 g/dL (ref 12.0–16.0)

## 2013-10-14 LAB — URINE CULTURE
Colony Count: NO GROWTH
Organism ID, Bacteria: NO GROWTH

## 2013-10-14 NOTE — Telephone Encounter (Signed)
Message left to return call to [place] at [phone].   Will need to clarify that Dr. Inda Merlin is PCP and can send message to that provider after speaking with patient regarding lab results.

## 2013-10-14 NOTE — Telephone Encounter (Signed)
Message copied by Michele Mcalpine on Fri Oct 14, 2013 11:27 AM ------      Message from: Regina Eck      Created: Fri Oct 14, 2013  8:27 AM       Notify patient her urine microscopic was negative for blood      Her blood glucose was 201 which is why she had glucose in urine. It is very important to follow up with PCP with management      This needs to be sent to PCP with note of glucosuria noted on OV here. Patient indicated she has slight problem with glucose. ------

## 2013-10-17 ENCOUNTER — Encounter: Payer: Self-pay | Admitting: Emergency Medicine

## 2013-10-17 NOTE — Telephone Encounter (Signed)
Message left to return call to Tina [name] at [phone].    

## 2013-10-17 NOTE — Telephone Encounter (Signed)
Patient returning Tracy's call. °

## 2013-10-17 NOTE — Telephone Encounter (Signed)
Message copied by Michele Mcalpine on Mon Oct 17, 2013  8:21 AM ------      Message from: Regina Eck      Created: Sun Oct 16, 2013  1:46 PM       Notify urine culture also negative when patient calls back ------

## 2013-10-19 NOTE — Telephone Encounter (Signed)
Spoke with patient and message from Regina Eck CNM given. She will follow up with Dr. Inda Merlin.   Routing to provider for final review. Patient agreeable to disposition. Will close encounter

## 2013-10-21 NOTE — Progress Notes (Signed)
Reviewed personally.  M. Suzanne Refoel Palladino, MD.  

## 2014-02-28 ENCOUNTER — Encounter: Payer: BC Managed Care – PPO | Attending: Internal Medicine | Admitting: *Deleted

## 2014-02-28 ENCOUNTER — Encounter: Payer: Self-pay | Admitting: *Deleted

## 2014-02-28 VITALS — Ht 62.0 in | Wt 186.2 lb

## 2014-02-28 DIAGNOSIS — E119 Type 2 diabetes mellitus without complications: Secondary | ICD-10-CM | POA: Diagnosis present

## 2014-02-28 DIAGNOSIS — Z713 Dietary counseling and surveillance: Secondary | ICD-10-CM | POA: Diagnosis present

## 2014-02-28 NOTE — Patient Instructions (Addendum)
Plan:  Aim for 2-3 Carb Choices per meal (30-45 grams) +/- 1 either way  Aim for 0-15 Carbs per snack if hungry  Include protein in moderation with your meals and snacks Consider reading food labels for Total Carbohydrate and Fat Grams of foods Consider  increasing your activity level by exercising for 30 minutes daily as tolerated Consider checking BG at alternate times per day as directed by MD  Continue taking medication as directed by MD  Food options: [place] Reduced Calorie Bread  (white or wheat)

## 2014-02-28 NOTE — Progress Notes (Signed)
Appt start time: 1600 end time:  1730.  Assessment:  Patient was seen on  02/28/14 for individual diabetes education. Tina [name] presents with a new diagnosis of T2DM. Her husband is also T2DM. Her husband does the shopping and food preparation. She feels that her appetite has declined since beginning the Invokamet. She also has a new job that has decreased her stress level.  Patient Education Plan per assessed needs and concerns is to attend individual session for Diabetes Self Management Education.  Current HbA1c: 8.1%  Preferred Learning Style:   No preference indicated   Learning Readiness:   Ready  MEDICATIONS: Invokamet 50-500  DIETARY INTAKE: Feeling less hungry since starting Metformin  B (6:45 AM): greek yogurt, low cal, low carb, sugar Snk ( AM): none  L (1:00 PM): grilled chicken, vegetable, rice/  [employer] hamburger (31g), small fry (13g), unsweet tea (0) Snk ( PM): none D ( PM): grilled hamburger, wheat bun, onion, tomato, macaroni and cheese / steak, 1/2 baked potato, green beans Snk (7:00 PM): None Beverages: coffee, unsweet tea, water /lime, regular coke (2X per month)  Usual physical activity: none at this time  Intervention:  Nutrition counseling provided.  Discussed diabetes disease process and treatment options.  Discussed physiology of diabetes and role of obesity on insulin resistance.  Encouraged moderate weight reduction to improve glucose levels.  Discussed role of medications and diet in glucose control  Provided education on macronutrients on glucose levels.  Provided education on carb counting, importance of regularly scheduled meals/snacks, and meal planning  Discussed effects of physical activity on glucose levels and long-term glucose control.  Recommended 150 minutes of physical activity/week.  Reviewed patient medications.  Discussed role of medication on blood glucose and possible side effects  Discussed blood glucose monitoring and interpretation.   Discussed recommended target ranges and individual ranges.    Described short-term complications: hyper- and hypo-glycemia.  Discussed causes,symptoms, and treatment options.  Discussed prevention, detection, and treatment of long-term complications.  Discussed the role of prolonged elevated glucose levels on body systems.  Discussed role of stress on blood glucose levels and discussed strategies to manage psychosocial issues.  Discussed recommendations for long-term diabetes self-care.  Established checklist for medical, dental, and emotional self-care.  Plan:  Aim for 2-3 Carb Choices per meal (30-45 grams) +/- 1 either way  Aim for 0-15 Carbs per snack if hungry  Include protein in moderation with your meals and snacks Consider reading food labels for Total Carbohydrate and Fat Grams of foods Consider  increasing your activity level by exercising for 30 minutes daily as tolerated Consider checking BG at alternate times per day as directed by MD  Continue taking medication as directed by MD  Food options: [place] Reduced Calorie Bread  (white or wheat) for reduced carbohydrate  Teaching Method Utilized:  Visual Auditory Hands on  Handouts given during visit include: Living Well with Diabetes Carb Counting and Food Label handouts Meal Plan Card Support Group My Plate Snack Sheet  Barriers to learning/adherence to lifestyle change: Commitment,  Diabetes self-care support plan:   [facility] support group  Husband who is also T2DM  Demonstrated degree of understanding via:  Teach Back   Monitoring/Evaluation:  Dietary intake, exercise, monitor glucose, and body weight prn.

## 2014-02-28 NOTE — Progress Notes (Signed)
Patient referral requested glucose meter instruction. We did not have enough time to provide this service during this visit. Patient will contact provider for guidance. Options: schedule appointment for starting meter, receive from PCP office, have order sent to pharmacy for meter and supplies and pharmacist will assist with instruction. Edrick Oh, RN, BSN, CDE

## 2014-03-16 ENCOUNTER — Other Ambulatory Visit: Payer: Self-pay

## 2014-03-16 DIAGNOSIS — Z1231 Encounter for screening mammogram for malignant neoplasm of breast: Secondary | ICD-10-CM

## 2014-03-20 ENCOUNTER — Ambulatory Visit
Admission: RE | Admit: 2014-03-20 | Discharge: 2014-03-20 | Disposition: A | Discharge status: Home / Self Care | Payer: BC Managed Care – PPO | Source: Ambulatory Visit

## 2014-03-20 DIAGNOSIS — Z1231 Encounter for screening mammogram for malignant neoplasm of breast: Secondary | ICD-10-CM

## 2014-06-05 ENCOUNTER — Encounter: Payer: Self-pay | Admitting: *Deleted

## 2014-10-16 ENCOUNTER — Ambulatory Visit: Payer: BC Managed Care – PPO | Admitting: Certified Nurse Midwife

## 2015-01-05 ENCOUNTER — Ambulatory Visit: Payer: Self-pay | Admitting: Certified Nurse Midwife

## 2015-04-20 ENCOUNTER — Ambulatory Visit: Payer: Self-pay | Admitting: Certified Nurse Midwife

## 2015-08-31 ENCOUNTER — Encounter: Payer: Self-pay | Admitting: Certified Nurse Midwife

## 2015-08-31 ENCOUNTER — Ambulatory Visit (INDEPENDENT_AMBULATORY_CARE_PROVIDER_SITE_OTHER): Payer: BLUE CROSS/BLUE SHIELD | Admitting: Certified Nurse Midwife

## 2015-08-31 VITALS — BP 124/80 | HR 74 | Resp 16 | Ht 61.25 in | Wt 186.0 lb

## 2015-08-31 DIAGNOSIS — Z124 Encounter for screening for malignant neoplasm of cervix: Secondary | ICD-10-CM

## 2015-08-31 DIAGNOSIS — Z01419 Encounter for gynecological examination (general) (routine) without abnormal findings: Secondary | ICD-10-CM | POA: Diagnosis not present

## 2015-08-31 NOTE — Patient Instructions (Signed)
EXERCISE AND DIET:  We recommended that you start or continue a regular exercise program for good health. Regular exercise means any activity that makes your heart beat faster and makes you sweat.  We recommend exercising at least 30 minutes per day at least 3 days a week, preferably 4 or 5.  We also recommend a diet low in fat and sugar.  Inactivity, poor dietary choices and obesity can cause diabetes, heart attack, stroke, and kidney damage, among others.    ALCOHOL AND SMOKING:  Women should limit their alcohol intake to no more than 7 drinks/beers/glasses of wine (combined, not each!) per week. Moderation of alcohol intake to this level decreases your risk of breast cancer and liver damage. And of course, no recreational drugs are part of a healthy lifestyle.  And absolutely no smoking or even second hand smoke. Most people know smoking can cause heart and lung diseases, but did you know it also contributes to weakening of your bones? Aging of your skin?  Yellowing of your teeth and nails?  CALCIUM AND VITAMIN D:  Adequate intake of calcium and Vitamin D are recommended.  The recommendations for exact amounts of these supplements seem to change often, but generally speaking 600 mg of calcium (either carbonate or citrate) and 800 units of Vitamin D per day seems prudent. Certain women may benefit from higher intake of Vitamin D.  If you are among these women, your doctor will have told you during your visit.    PAP SMEARS:  Pap smears, to check for cervical cancer or precancers,  have traditionally been done yearly, although recent scientific advances have shown that most women can have pap smears less often.  However, every woman still should have a physical exam from her gynecologist every year. It will include a breast check, inspection of the vulva and vagina to check for abnormal growths or skin changes, a visual exam of the cervix, and then an exam to evaluate the size and shape of the uterus and  ovaries.  And after 57 years of age, a rectal exam is indicated to check for rectal cancers. We will also provide age appropriate advice regarding health maintenance, like when you should have certain vaccines, screening for sexually transmitted diseases, bone density testing, colonoscopy, mammograms, etc.   MAMMOGRAMS:  All women over 40 years old should have a yearly mammogram. Many facilities now offer a "3D" mammogram, which may cost around $50 extra out of pocket. If possible,  we recommend you accept the option to have the 3D mammogram performed.  It both reduces the number of women who will be called back for extra views which then turn out to be normal, and it is better than the routine mammogram at detecting truly abnormal areas.    COLONOSCOPY:  Colonoscopy to screen for colon cancer is recommended for all women at age 50.  We know, you hate the idea of the prep.  We agree, BUT, having colon cancer and not knowing it is worse!!  Colon cancer so often starts as a polyp that can be seen and removed at colonscopy, which can quite literally save your life!  And if your first colonoscopy is normal and you have no family history of colon cancer, most women don't have to have it again for 10 years.  Once every ten years, you can do something that may end up saving your life, right?  We will be happy to help you get it scheduled when you are ready.    Be sure to check your insurance coverage so you understand how much it will cost.  It may be covered as a preventative service at no cost, but you should check your particular policy.     Exercising to Lose Weight Exercising can help you to lose weight. In order to lose weight through exercise, you need to do vigorous-intensity exercise. You can tell that you are exercising with vigorous intensity if you are breathing very hard and fast and cannot hold a conversation while exercising. Moderate-intensity exercise helps to maintain your current weight. You can  tell that you are exercising at a moderate level if you have a higher heart rate and faster breathing, but you are still able to hold a conversation. HOW OFTEN SHOULD I EXERCISE? Choose an activity that you enjoy and set realistic goals. Your health care provider can help you to make an activity plan that works for you. Exercise regularly as directed by your health care provider. This may include:  Doing resistance training twice each week, such as:  Push-ups.  Sit-ups.  Lifting weights.  Using resistance bands.  Doing a given intensity of exercise for a given amount of time. Choose from these options:  150 minutes of moderate-intensity exercise every week.  75 minutes of vigorous-intensity exercise every week.  A mix of moderate-intensity and vigorous-intensity exercise every week. Children, pregnant women, people who are out of shape, people who are overweight, and older adults may need to consult a health care provider for individual recommendations. If you have any sort of medical condition, be sure to consult your health care provider before starting a new exercise program. WHAT ARE SOME ACTIVITIES THAT CAN HELP ME TO LOSE WEIGHT?   Walking at a rate of at least 4.5 miles an hour.  Jogging or running at a rate of 5 miles per hour.  Biking at a rate of at least 10 miles per hour.  Lap swimming.  Roller-skating or in-line skating.  Cross-country skiing.  Vigorous competitive sports, such as football, basketball, and soccer.  Jumping rope.  Aerobic dancing. HOW CAN I BE MORE ACTIVE IN MY DAY-TO-DAY ACTIVITIES?  Use the stairs instead of the elevator.  Take a walk during your lunch break.  If you drive, park your car farther away from work or school.  If you take public transportation, get off one stop early and walk the rest of the way.  Make all of your phone calls while standing up and walking around.  Get up, stretch, and walk around every 30 minutes  throughout the day. WHAT GUIDELINES SHOULD I FOLLOW WHILE EXERCISING?  Do not exercise so much that you hurt yourself, feel dizzy, or get very short of breath.  Consult your health care provider prior to starting a new exercise program.  Wear comfortable clothes and shoes with good support.  Drink plenty of water while you exercise to prevent dehydration or heat stroke. Body water is lost during exercise and must be replaced.  Work out until you breathe faster and your heart beats faster.   This information is not intended to replace advice given to you by your health care provider. Make sure you discuss any questions you have with your health care provider.   Document Released: 08/23/2010 Document Revised: 08/11/2014 Document Reviewed: 12/22/2013 Elsevier Interactive Patient Education 2016 Elsevier Inc.  

## 2015-08-31 NOTE — Progress Notes (Signed)
54 y.o. G87P0030 Married  Caucasian Fe here for annual exam. Menopausal no vaginal bleeding or vaginal dryness. Patient having dry skin issues and using Eucerin cream and Sarna lotion with good relief. Sees Dr.Gates for hypertension, cholesterol, diabetes medication management/aex/labs. Plans working on weight loss and change in diet habits. Patient was seen for knee issue with Orthopedic and had xray and noted fibroid.   Busy with work. No other health issues.  Patient's last menstrual period was 05/04/2012.          Sexually active: Yes.    The current method of family planning is condoms sometimes.    Exercising: No.  exercise Smoker:  no  Health Maintenance: possible fibroid per patient Pap:  10-12-12 neg HPV HR neg MMG:  03-20-14 category b density,birads 1:neg Colonoscopy:  2011 neg f/u 22yrs BMD:   none TDaP:  2013 Shingles: no Pneumonia: no Hep C and HIV: not done Labs: pcp Self breast exam: done occ   reports that she quit smoking about 48 years ago. She has never used smokeless tobacco. She reports that she drinks about 0.6 - 3.0 oz of alcohol per week. She reports that she does not use illicit drugs.  Past Medical History  Diagnosis Date  . Hypertension   . Menorrhagia   . Endometrial polyp   . Hyperlipidemia   . Anxiety   . Tubal pregnancy   . Diabetes mellitus without complication [facility])     Past Surgical History  Procedure Laterality Date  . Hysteroscopic resection      /rollerball ablation  . Laparoscopic cholecystectomy  9/07  . Dilation and curettage of uterus      Current Outpatient Prescriptions  Medication Sig Dispense Refill  . amLODipine (NORVASC) 5 MG tablet Take 5 mg by mouth daily.  2  . hydrochlorothiazide (MICROZIDE) 12.5 MG capsule Take 12.5 mg by mouth every morning.  5  . LORazepam (ATIVAN) 0.5 MG tablet     . losartan (COZAAR) 100 MG tablet TAKE 1 TABLET ONCE A DAY ORALLY  3  . metFORMIN (GLUCOPHAGE) 500 MG tablet Take 500 mg by mouth 2 (two)  times daily with a meal.  3  . metoprolol (LOPRESSOR) 100 MG tablet Take 100 mg by mouth 2 (two) times daily with a meal.  3  . simvastatin (ZOCOR) 20 MG tablet TAJE 1 TABLET IN THE EVENING ONCE A DAY ORALLY  3   No current facility-administered medications for this visit.    Family History  Problem Relation Age of Onset  . Hypertension Mother   . Heart disease Mother   . Heart attack Mother   . Hypertension Father   . Heart disease Father   . Heart attack Father   . Heart attack Brother     ROS:  Pertinent items are noted in HPI.  Otherwise, a comprehensive ROS was negative.  Exam:   BP 124/80 mmHg  Pulse 74  Resp 16  Ht 5' 1.25" (1.556 m)  Wt 186 lb (84.369 kg)  BMI 34.85 kg/m2  LMP 05/04/2012 Height: 5' 1.25" (155.6 cm) Ht Readings from Last 3 Encounters:  08/31/15 5' 1.25" (1.556 m)  02/28/14 5\' 2"  (1.575 m)  10/13/13 5' 1.75" (1.568 m)    General appearance: alert, cooperative and appears stated age Head: Normocephalic, without obvious abnormality, atraumatic Neck: no adenopathy, supple, symmetrical, trachea midline and thyroid normal to inspection and palpation Lungs: clear to auscultation bilaterally Breasts: normal appearance, no masses or tenderness, No nipple retraction or dimpling,  No nipple discharge or bleeding, No axillary or supraclavicular adenopathy Heart: regular rate and rhythm Abdomen: soft, non-tender; no masses,  no organomegaly Extremities: extremities normal, atraumatic, no cyanosis or edema Skin: Skin color, texture, turgor normal. No rashes or lesions Lymph nodes: Cervical, supraclavicular, and axillary nodes normal. No abnormal inguinal nodes palpated Neurologic: Grossly normal   Pelvic: External genitalia:  no lesions              Urethra:  normal appearing urethra with no masses, tenderness or lesions              Bartholin's and Skene's: normal                 Vagina: normal appearing vagina with normal color and discharge, no lesions               Cervix: normal,non tender,no lesions              Pap taken: Yes.   Bimanual Exam:  Uterus:  normal size, contour, position, consistency, mobility, non-tender              Adnexa: normal adnexa and no mass, fullness, tenderness               Rectovaginal: Confirms               Anus:  normal sphincter tone, no lesions  Chaperone present: yes  A:  Well Woman with normal exam  Menopausal no HRT  Obesity  Hypertension/diabetes/cholesterol with MD management  Fibroid noted on xray with orthopedic.  P:   Reviewed health and wellness pertinent to exam  Aware of need to evaluate if vaginal bleeding  Discussed weight loss options and exercise options, plans to start soon with nieces.  Continue follow up with PCP as indicated  Patient will sign for records and will be reviewed and she will be called regarding recommendations.  Pap smear as above with HPV reflex   counseled on breast self exam, stressed  mammography screening, plans to schedule soon, menopause, adequate intake of calcium and vitamin D, diet and exercise  return annually or prn  An After Visit Summary was printed and given to the patient.

## 2015-09-02 NOTE — Progress Notes (Signed)
Reviewed personally.  M. Suzanne Shelma Eiben, MD.  

## 2015-09-03 ENCOUNTER — Telehealth: Payer: Self-pay | Admitting: Certified Nurse Midwife

## 2015-09-03 LAB — IPS PAP TEST WITH REFLEX TO HPV

## 2015-09-03 NOTE — Telephone Encounter (Signed)
Tina [name] from [facility] called to confirm whether our office needs the xray films or just the office visit notes related to the films requested. If Tina [name], CNM wants the films, they need to know how she wants them delivered.  Cc: Tina [name] to follow signed release.

## 2015-09-04 NOTE — Telephone Encounter (Signed)
Just the office visit notes not films are all that is needed.

## 2015-09-05 ENCOUNTER — Telehealth: Payer: Self-pay | Admitting: Certified Nurse Midwife

## 2015-09-05 NOTE — Telephone Encounter (Signed)
Left patient a message regarding the medical release of information the patient signed. Tina [name] and [facility] called stating that the patient would need to sign and pick up the X-ray herself because we are asking for the actual X-Ray.

## 2015-09-05 NOTE — Telephone Encounter (Signed)
Spoke with patient and she will go to [employer] and [place] tomorrow and pick up X-ray.

## 2015-09-18 ENCOUNTER — Telehealth: Payer: Self-pay

## 2015-09-18 DIAGNOSIS — D259 Leiomyoma of uterus, unspecified: Secondary | ICD-10-CM

## 2015-09-18 NOTE — Telephone Encounter (Signed)
Spoke with patient. Advised of recommendation from Melvia Heaps CNM regarding follow up PUS in office with Dr.Miller after review of her previous imaging. She is agreeable. She is requesting a morning appointment due to work schedule. Appointment scheduled for 10/11/2015 at 9 am with 9:30 am consult with Dr.Miller. She is agreeable to date and time. Order for PUS placed for precert.  Cc: Melvia Heaps CNM  Routing to provider for final review. Patient agreeable to disposition. Will close encounter.

## 2015-09-18 NOTE — Telephone Encounter (Signed)
Left message to call [place] at [phone].  Need to advise patient that Tina [name] CNM has reviewed her prior ultrasound and x-ray results with Dr.Miller. She will need to have a follow up ultrasound in the office with Dr.Miller due to the position of the fibroid.

## 2015-10-02 ENCOUNTER — Telehealth: Payer: Self-pay | Admitting: Obstetrics & Gynecology

## 2015-10-02 NOTE — Telephone Encounter (Signed)
Called patient to review benefits for a scheduled procedure. Left Voicemail requesting a call back.

## 2015-10-03 NOTE — Telephone Encounter (Signed)
Spoke with pt regarding benefit for ultrsound. Patient understood and agreeable. Patient ready to schedule. Patient scheduled 10/18/15 with Dr Sabra Heck. Pt aware of arrival date and time. Pt aware of 72 hours cancellation policy with [id number] fee. No further questions. Ok to close

## 2015-10-11 ENCOUNTER — Other Ambulatory Visit: Payer: BLUE CROSS/BLUE SHIELD | Admitting: Obstetrics & Gynecology

## 2015-10-11 ENCOUNTER — Other Ambulatory Visit: Payer: BLUE CROSS/BLUE SHIELD

## 2015-10-18 ENCOUNTER — Ambulatory Visit (INDEPENDENT_AMBULATORY_CARE_PROVIDER_SITE_OTHER): Payer: BLUE CROSS/BLUE SHIELD | Admitting: Obstetrics & Gynecology

## 2015-10-18 ENCOUNTER — Ambulatory Visit (INDEPENDENT_AMBULATORY_CARE_PROVIDER_SITE_OTHER): Payer: BLUE CROSS/BLUE SHIELD

## 2015-10-18 VITALS — BP 138/90 | HR 62 | Resp 16 | Ht 61.25 in | Wt 188.0 lb

## 2015-10-18 DIAGNOSIS — D251 Intramural leiomyoma of uterus: Secondary | ICD-10-CM | POA: Diagnosis not present

## 2015-10-18 DIAGNOSIS — D259 Leiomyoma of uterus, unspecified: Secondary | ICD-10-CM | POA: Diagnosis not present

## 2015-10-18 NOTE — Progress Notes (Signed)
55 y.o. G11P0030 Married Caucasian female here for pelvic ultrasound.  Pt had x-ray at [facility] that showed possible fibroid.  She brought it to [place] for additional recommendations.  As this was just a pelvic x-ray showing possible fibroid, recommended PUS.  Pt denies vaginal bleeding, pelvic pain, vaginal discharge or pain with intercourse.  Last Pap smear:  08/31/15, normal.  Patient's last menstrual period was 05/04/2012.  Contraception: PMP status  Findings:  UTERUS: 6.2 x 5.4 x 4.3cm with several intramural fibroids 2.7cm, 3.1cm, and 2.2cm.  The 2.2cm fibroid appears degenerating. EMS: 3.36mm ADNEXA: Left ovary:  2.4 x 1.6 x 1.4cm       Right ovary: 2.8 x 1.7 x 1.9cm CUL DE SAC: no free fluid  Discussion:  Pt doesn't really have a lot of comparison imaging.  She did have a PUS in 2003 showing a 1.6cm fibroid at that time.  As pt is otherwise asymptomatic, I think this is ok to follow on physical exam and with any change in symptoms.  Pt is aware if she has any vaginal bleeding, she needs to call.  Also, if she has new onset of pelvic pain, she needs to be seen.  Benign nature of fibroids discussed.  As there has been at least one fibroid for ~14 years, and she's only gone into menopause in the last few years, seeing additional fibroids is not worrisome.  Pt comfortable with plan.  All questions answered.  Assessment:  Uterine fibroids, asymptomatic  Plan:  Follow up with physical exam in one year.  Pt knows to be seen sooner if she has any change in symptoms discussed above.   ~15 minutes spent with patient >50% of time was in face to face discussion of above.       Addendum: the last two frames are mislabeled as lt ov.They should read: sagittal  Uterus.

## 2015-10-22 ENCOUNTER — Other Ambulatory Visit: Payer: Self-pay | Admitting: *Deleted

## 2015-10-22 DIAGNOSIS — D259 Leiomyoma of uterus, unspecified: Secondary | ICD-10-CM

## 2015-11-02 ENCOUNTER — Encounter: Payer: Self-pay | Admitting: Obstetrics & Gynecology

## 2015-11-02 DIAGNOSIS — I10 Essential (primary) hypertension: Secondary | ICD-10-CM | POA: Insufficient documentation

## 2015-11-02 DIAGNOSIS — D251 Intramural leiomyoma of uterus: Secondary | ICD-10-CM | POA: Insufficient documentation

## 2015-11-21 ENCOUNTER — Other Ambulatory Visit: Payer: Self-pay

## 2015-11-21 DIAGNOSIS — Z1231 Encounter for screening mammogram for malignant neoplasm of breast: Secondary | ICD-10-CM

## 2015-12-14 ENCOUNTER — Ambulatory Visit
Admission: RE | Admit: 2015-12-14 | Discharge: 2015-12-14 | Disposition: A | Discharge status: Home / Self Care | Payer: BLUE CROSS/BLUE SHIELD | Source: Ambulatory Visit

## 2015-12-14 DIAGNOSIS — Z1231 Encounter for screening mammogram for malignant neoplasm of breast: Secondary | ICD-10-CM | POA: Diagnosis not present

## 2016-04-09 ENCOUNTER — Telehealth: Payer: Self-pay | Admitting: Obstetrics & Gynecology

## 2016-04-09 NOTE — Telephone Encounter (Signed)
Called patient to review benefits for a recommended procedure. Left Voicemail requesting a call back. °

## 2016-04-09 NOTE — Telephone Encounter (Signed)
Please disregard previous phone message time stamped 04/09/16 at 10:46 AM, this was documented in error

## 2016-04-09 NOTE — Telephone Encounter (Signed)
Reviewed benefits with patient. She has provided $250 deposit. Please notify once scheduled in order to collect balance.

## 2016-04-10 NOTE — Telephone Encounter (Signed)
Called patient to review benefits for a recommended procedure. Left Voicemail requesting a call back. °

## 2016-04-14 ENCOUNTER — Encounter: Payer: Self-pay | Admitting: Obstetrics & Gynecology

## 2016-04-14 NOTE — Telephone Encounter (Signed)
Patient called and cancelled her ultrasound appointment for Thursday, 04/16/16.

## 2016-04-17 ENCOUNTER — Other Ambulatory Visit: Payer: BLUE CROSS/BLUE SHIELD | Admitting: Obstetrics & Gynecology

## 2016-04-17 ENCOUNTER — Other Ambulatory Visit: Payer: BLUE CROSS/BLUE SHIELD

## 2016-05-02 DIAGNOSIS — E1165 Type 2 diabetes mellitus with hyperglycemia: Secondary | ICD-10-CM | POA: Diagnosis not present

## 2016-05-02 DIAGNOSIS — E559 Vitamin D deficiency, unspecified: Secondary | ICD-10-CM | POA: Diagnosis not present

## 2016-05-02 DIAGNOSIS — E785 Hyperlipidemia, unspecified: Secondary | ICD-10-CM | POA: Diagnosis not present

## 2016-05-02 DIAGNOSIS — Z0000 Encounter for general adult medical examination without abnormal findings: Secondary | ICD-10-CM | POA: Diagnosis not present

## 2016-05-02 DIAGNOSIS — I10 Essential (primary) hypertension: Secondary | ICD-10-CM | POA: Diagnosis not present

## 2016-05-02 DIAGNOSIS — Z79899 Other long term (current) drug therapy: Secondary | ICD-10-CM | POA: Diagnosis not present

## 2016-05-02 DIAGNOSIS — F419 Anxiety disorder, unspecified: Secondary | ICD-10-CM | POA: Diagnosis not present

## 2016-05-02 DIAGNOSIS — Z23 Encounter for immunization: Secondary | ICD-10-CM | POA: Diagnosis not present

## 2016-07-08 NOTE — Telephone Encounter (Signed)
Patient last annual Jan 2017 with Debbi.  Had pelvic ultrasound 10-2015 and 6 month follow-up was scheduled on the way out( for September 2017). Patient canceled this appointment stating she was not aware of it and was not expecting to come back for one year.  Office note from [place] 10-18-15 confirms recommendation for PUS and exam in one year.  Has annual exam scheduled with French Ana, CNM for 09-2016.   Ok to cancel order from 04-2016 for 6 month follow-up?

## 2016-07-08 NOTE — Telephone Encounter (Signed)
Yes.  Ok to cancer order from 9/17 for 6 month follow up.  Thanks.

## 2016-07-08 NOTE — Telephone Encounter (Signed)
Order removed, encounter closed.  CC: Lerry Liner.

## 2016-09-05 ENCOUNTER — Ambulatory Visit (INDEPENDENT_AMBULATORY_CARE_PROVIDER_SITE_OTHER): Payer: BLUE CROSS/BLUE SHIELD | Admitting: Certified Nurse Midwife

## 2016-09-05 ENCOUNTER — Telehealth: Payer: Self-pay | Admitting: Certified Nurse Midwife

## 2016-09-05 ENCOUNTER — Encounter: Payer: Self-pay | Admitting: Certified Nurse Midwife

## 2016-09-05 VITALS — BP 138/84 | HR 80 | Resp 16 | Ht 61.5 in | Wt 189.8 lb

## 2016-09-05 DIAGNOSIS — Z0000 Encounter for general adult medical examination without abnormal findings: Secondary | ICD-10-CM | POA: Diagnosis not present

## 2016-09-05 DIAGNOSIS — Z01419 Encounter for gynecological examination (general) (routine) without abnormal findings: Secondary | ICD-10-CM

## 2016-09-05 LAB — POCT URINALYSIS DIPSTICK
BILIRUBIN UA: NEGATIVE
Blood, UA: NEGATIVE
GLUCOSE UA: NEGATIVE
Ketones, UA: NEGATIVE
Leukocytes, UA: NEGATIVE
Nitrite, UA: NEGATIVE
UROBILINOGEN UA: NEGATIVE
pH, UA: 5

## 2016-09-05 NOTE — Patient Instructions (Signed)

## 2016-09-05 NOTE — Progress Notes (Signed)
56 y.o. [id number] Married  Caucasian Fe here for annual exam.Menopausal no HRT. Denies vaginal bleeding, some vaginal dryness. Sees Dr. Inda Merlin for aex/labs and hypertension/glucose/cholesterol  Management. All stable. Very busy with work. No other health issues.  Exercising: Yes.    Treadmill Smoker:  no  Health Maintenance: Pap:  08-14-12 neg HPV HR neg, 08-31-15 neg MMG:  12-14-15 category b density birads 1:neg Colonoscopy:  2011 neg f/u 17yrs BMD:   none TDaP:  2013 Shingles: [id number] Pneumonia: no Hep C and HIV: PCP Labs: PCP Self breast exam: Yes   reports that she quit smoking about 49 years ago. She has never used smokeless tobacco. She reports that she drinks about 0.6 - 3.0 oz of alcohol per week . She reports that she does not use drugs.  Past Medical History:  Diagnosis Date  . Anxiety   . Diabetes mellitus without complication ([place])   . Endometrial polyp   . Hyperlipidemia   . Hypertension   . Menorrhagia   . Tubal pregnancy     Past Surgical History:  Procedure Laterality Date  . DILATION AND CURETTAGE OF UTERUS    . hysteroscopic resection     /rollerball ablation  . LAPAROSCOPIC CHOLECYSTECTOMY  9/07    Current Outpatient Prescriptions  Medication Sig Dispense Refill  . amLODipine (NORVASC) 5 MG tablet Take 5 mg by mouth daily.  2  . hydrochlorothiazide (MICROZIDE) 12.5 MG capsule Take 12.5 mg by mouth every morning.  5  . LORazepam (ATIVAN) 0.5 MG tablet     . losartan (COZAAR) 100 MG tablet TAKE 1 TABLET ONCE A DAY ORALLY  3  . metFORMIN (GLUCOPHAGE) 500 MG tablet Take 500 mg by mouth 2 (two) times daily with a meal.  3  . metoprolol (LOPRESSOR) 100 MG tablet Take 100 mg by mouth 2 (two) times daily with a meal.  3  . simvastatin (ZOCOR) 20 MG tablet TAJE 1 TABLET IN THE EVENING ONCE A DAY ORALLY  3   No current facility-administered medications for this visit.     Family History  Problem Relation Age of Onset  . Hypertension Mother   . Heart disease  Mother   . Heart attack Mother   . Hypertension Father   . Heart disease Father   . Heart attack Father   . Heart attack Brother     ROS:  Pertinent items are noted in HPI.  Otherwise, a comprehensive ROS was negative.  Exam:   BP 138/84 (BP Location: Right Arm, Patient Position: Sitting, Cuff Size: Normal)   Pulse 80   Resp 16   Ht 5' 1.5" (1.562 m)   Wt 189 lb 12.8 oz (86.1 kg)   LMP 05/04/2012   BMI 35.28 kg/m  Height: 5' 1.5" (156.2 cm) Ht Readings from Last 3 Encounters:  09/05/16 5' 1.5" (1.562 m)  10/18/15 5' 1.25" (1.556 m)  08/31/15 5' 1.25" (1.556 m)    General appearance: alert, cooperative and appears stated age Head: Normocephalic, without obvious abnormality, atraumatic Neck: no adenopathy, supple, symmetrical, trachea midline and thyroid normal to inspection and palpation Lungs: clear to auscultation bilaterally Breasts: normal appearance, no masses or tenderness, No nipple retraction or dimpling, No nipple discharge or bleeding, No axillary or supraclavicular adenopathy Heart: regular rate and rhythm Abdomen: soft, non-tender; no masses,  no organomegaly Extremities: extremities normal, atraumatic, no cyanosis or edema Skin: Skin color, texture, turgor normal. No rashes or lesions Lymph nodes: Cervical, supraclavicular, and axillary nodes normal. No abnormal inguinal  nodes palpated Neurologic: Grossly normal   Pelvic: External genitalia:  no lesions              Urethra:  normal appearing urethra with no masses, tenderness or lesions              Bartholin's and Skene's: normal                 Vagina: normal appearing vagina with normal color and discharge, no lesions              Cervix: no cervical motion tenderness, no lesions and retroverted              Pap taken: No. Bimanual Exam:  Uterus:  normal size, contour, position, consistency, mobility, non-tender and nodular feel, history of fibroids              Adnexa: normal size, contour, position,  consistency, mobility, non-tender and nodular, history of fibroids               Rectovaginal: Confirms               Anus:  normal sphincter tone, no lesions  Chaperone present: yes  A:  Well Woman with normal exam  Menopausal  No HRT  Vaginal dryness  Hypertension/cholesterol management with PCP P:   Reviewed health and wellness pertinent to exam  Aware of need to evaluate if vaginal bleeding  Discussed coconut oil use for dryness, instructions given.patient will call if problems or no change  Pap smear as above  Not taken   counseled on breast self exam, mammography screening, menopause, adequate intake of calcium and vitamin D, diet and exercise  return annually or prn  An After Visit Summary was printed and given to the patient.

## 2016-09-05 NOTE — Telephone Encounter (Signed)
Patient had canceled her PUS/Consult 04/17/16 to "check fibroids". Patient was seen for her aex today and is asking if she needs to return for this PUS? Patient said you may leave details on her voicemail.

## 2016-09-05 NOTE — Telephone Encounter (Signed)
Dr. Sabra Heck - per OV note 10/18/15 patient to f/u with physical exam in one year. Telephone encounter dated 04/09/16 -ok to cancel 6 month follow up PUS. Can you review and  clarify if PUS still needed?   Cc: Melvia Heaps, CNM

## 2016-09-06 NOTE — Telephone Encounter (Signed)
Ok to cancel six month follow up PUS.  Thanks.

## 2016-09-07 NOTE — Progress Notes (Signed)
Encounter reviewed Tina Klausner, MD   

## 2016-09-08 NOTE — Telephone Encounter (Signed)
Left detailed message, ok per current dpr. Advised ok to cancel six month f/u PUS per Dr. Sabra Heck. Advised to return call to [facility] at [phone] for any additional questions/concerns.  Routing to provider for final review. Patient is agreeable to disposition. Will close encounter.

## 2016-12-11 DIAGNOSIS — F419 Anxiety disorder, unspecified: Secondary | ICD-10-CM | POA: Diagnosis not present

## 2016-12-11 DIAGNOSIS — E1165 Type 2 diabetes mellitus with hyperglycemia: Secondary | ICD-10-CM | POA: Diagnosis not present

## 2016-12-11 DIAGNOSIS — E785 Hyperlipidemia, unspecified: Secondary | ICD-10-CM | POA: Diagnosis not present

## 2016-12-11 DIAGNOSIS — I10 Essential (primary) hypertension: Secondary | ICD-10-CM | POA: Diagnosis not present

## 2016-12-11 DIAGNOSIS — E559 Vitamin D deficiency, unspecified: Secondary | ICD-10-CM | POA: Diagnosis not present

## 2017-04-13 ENCOUNTER — Other Ambulatory Visit: Payer: Self-pay | Admitting: Internal Medicine

## 2017-04-13 ENCOUNTER — Ambulatory Visit
Admission: RE | Admit: 2017-04-13 | Discharge: 2017-04-13 | Disposition: A | Discharge status: Home / Self Care | Payer: BLUE CROSS/BLUE SHIELD | Source: Ambulatory Visit | Attending: Internal Medicine | Admitting: Internal Medicine

## 2017-04-13 DIAGNOSIS — Z1231 Encounter for screening mammogram for malignant neoplasm of breast: Secondary | ICD-10-CM

## 2017-04-20 DIAGNOSIS — E785 Hyperlipidemia, unspecified: Secondary | ICD-10-CM | POA: Diagnosis not present

## 2017-04-20 DIAGNOSIS — Z23 Encounter for immunization: Secondary | ICD-10-CM | POA: Diagnosis not present

## 2017-04-20 DIAGNOSIS — I10 Essential (primary) hypertension: Secondary | ICD-10-CM | POA: Diagnosis not present

## 2017-04-20 DIAGNOSIS — F419 Anxiety disorder, unspecified: Secondary | ICD-10-CM | POA: Diagnosis not present

## 2017-04-20 DIAGNOSIS — E1165 Type 2 diabetes mellitus with hyperglycemia: Secondary | ICD-10-CM | POA: Diagnosis not present

## 2017-08-26 DIAGNOSIS — M19072 Primary osteoarthritis, left ankle and foot: Secondary | ICD-10-CM | POA: Diagnosis not present

## 2017-09-11 ENCOUNTER — Ambulatory Visit: Payer: BLUE CROSS/BLUE SHIELD | Admitting: Certified Nurse Midwife

## 2017-09-18 DIAGNOSIS — Z79899 Other long term (current) drug therapy: Secondary | ICD-10-CM | POA: Diagnosis not present

## 2017-09-18 DIAGNOSIS — F419 Anxiety disorder, unspecified: Secondary | ICD-10-CM | POA: Diagnosis not present

## 2017-09-18 DIAGNOSIS — Z0000 Encounter for general adult medical examination without abnormal findings: Secondary | ICD-10-CM | POA: Diagnosis not present

## 2017-09-18 DIAGNOSIS — E1165 Type 2 diabetes mellitus with hyperglycemia: Secondary | ICD-10-CM | POA: Diagnosis not present

## 2017-09-18 DIAGNOSIS — E559 Vitamin D deficiency, unspecified: Secondary | ICD-10-CM | POA: Diagnosis not present

## 2017-09-18 DIAGNOSIS — I10 Essential (primary) hypertension: Secondary | ICD-10-CM | POA: Diagnosis not present

## 2017-09-18 DIAGNOSIS — E785 Hyperlipidemia, unspecified: Secondary | ICD-10-CM | POA: Diagnosis not present

## 2017-12-11 ENCOUNTER — Telehealth: Payer: Self-pay | Admitting: Certified Nurse Midwife

## 2017-12-11 NOTE — Telephone Encounter (Signed)
Left message for patient to reschedule appointment.

## 2018-01-22 ENCOUNTER — Ambulatory Visit: Payer: BLUE CROSS/BLUE SHIELD | Admitting: Certified Nurse Midwife

## 2018-02-12 DIAGNOSIS — L237 Allergic contact dermatitis due to plants, except food: Secondary | ICD-10-CM | POA: Diagnosis not present

## 2018-03-24 DIAGNOSIS — I10 Essential (primary) hypertension: Secondary | ICD-10-CM | POA: Diagnosis not present

## 2018-03-24 DIAGNOSIS — E1165 Type 2 diabetes mellitus with hyperglycemia: Secondary | ICD-10-CM | POA: Diagnosis not present

## 2018-03-24 DIAGNOSIS — F419 Anxiety disorder, unspecified: Secondary | ICD-10-CM | POA: Diagnosis not present

## 2018-03-24 DIAGNOSIS — E11319 Type 2 diabetes mellitus with unspecified diabetic retinopathy without macular edema: Secondary | ICD-10-CM | POA: Diagnosis not present

## 2018-03-24 DIAGNOSIS — E785 Hyperlipidemia, unspecified: Secondary | ICD-10-CM | POA: Diagnosis not present

## 2018-06-09 ENCOUNTER — Other Ambulatory Visit: Payer: Self-pay | Admitting: Internal Medicine

## 2018-06-09 DIAGNOSIS — Z1231 Encounter for screening mammogram for malignant neoplasm of breast: Secondary | ICD-10-CM

## 2018-07-26 ENCOUNTER — Ambulatory Visit
Admission: RE | Admit: 2018-07-26 | Discharge: 2018-07-26 | Disposition: A | Discharge status: Home / Self Care | Payer: BLUE CROSS/BLUE SHIELD | Source: Ambulatory Visit | Attending: Internal Medicine | Admitting: Internal Medicine

## 2018-07-26 DIAGNOSIS — Z1231 Encounter for screening mammogram for malignant neoplasm of breast: Secondary | ICD-10-CM | POA: Diagnosis not present

## 2018-11-23 DIAGNOSIS — F419 Anxiety disorder, unspecified: Secondary | ICD-10-CM | POA: Diagnosis not present

## 2019-03-18 DIAGNOSIS — R509 Fever, unspecified: Secondary | ICD-10-CM | POA: Diagnosis not present

## 2019-06-21 ENCOUNTER — Other Ambulatory Visit: Payer: Self-pay | Admitting: Internal Medicine

## 2019-06-21 DIAGNOSIS — Z1231 Encounter for screening mammogram for malignant neoplasm of breast: Secondary | ICD-10-CM

## 2019-08-10 ENCOUNTER — Ambulatory Visit
Admission: RE | Admit: 2019-08-10 | Discharge: 2019-08-10 | Disposition: A | Discharge status: Home / Self Care | Payer: 59 | Source: Ambulatory Visit | Attending: Internal Medicine | Admitting: Internal Medicine

## 2019-08-10 ENCOUNTER — Other Ambulatory Visit: Payer: Self-pay

## 2019-08-10 DIAGNOSIS — Z1231 Encounter for screening mammogram for malignant neoplasm of breast: Secondary | ICD-10-CM

## 2019-08-12 ENCOUNTER — Ambulatory Visit: Payer: BLUE CROSS/BLUE SHIELD

## 2019-10-26 ENCOUNTER — Encounter: Payer: Self-pay | Admitting: Certified Nurse Midwife

## 2020-04-21 IMAGING — MG DIGITAL SCREENING BILAT W/ CAD
4 series · 4 of 4 positions shown · non-contrast
Comparison: Previous exam(s).

CLINICAL DATA: Screening.

EXAM:
DIGITAL SCREENING BILATERAL MAMMOGRAM WITH CAD

[R MLO]
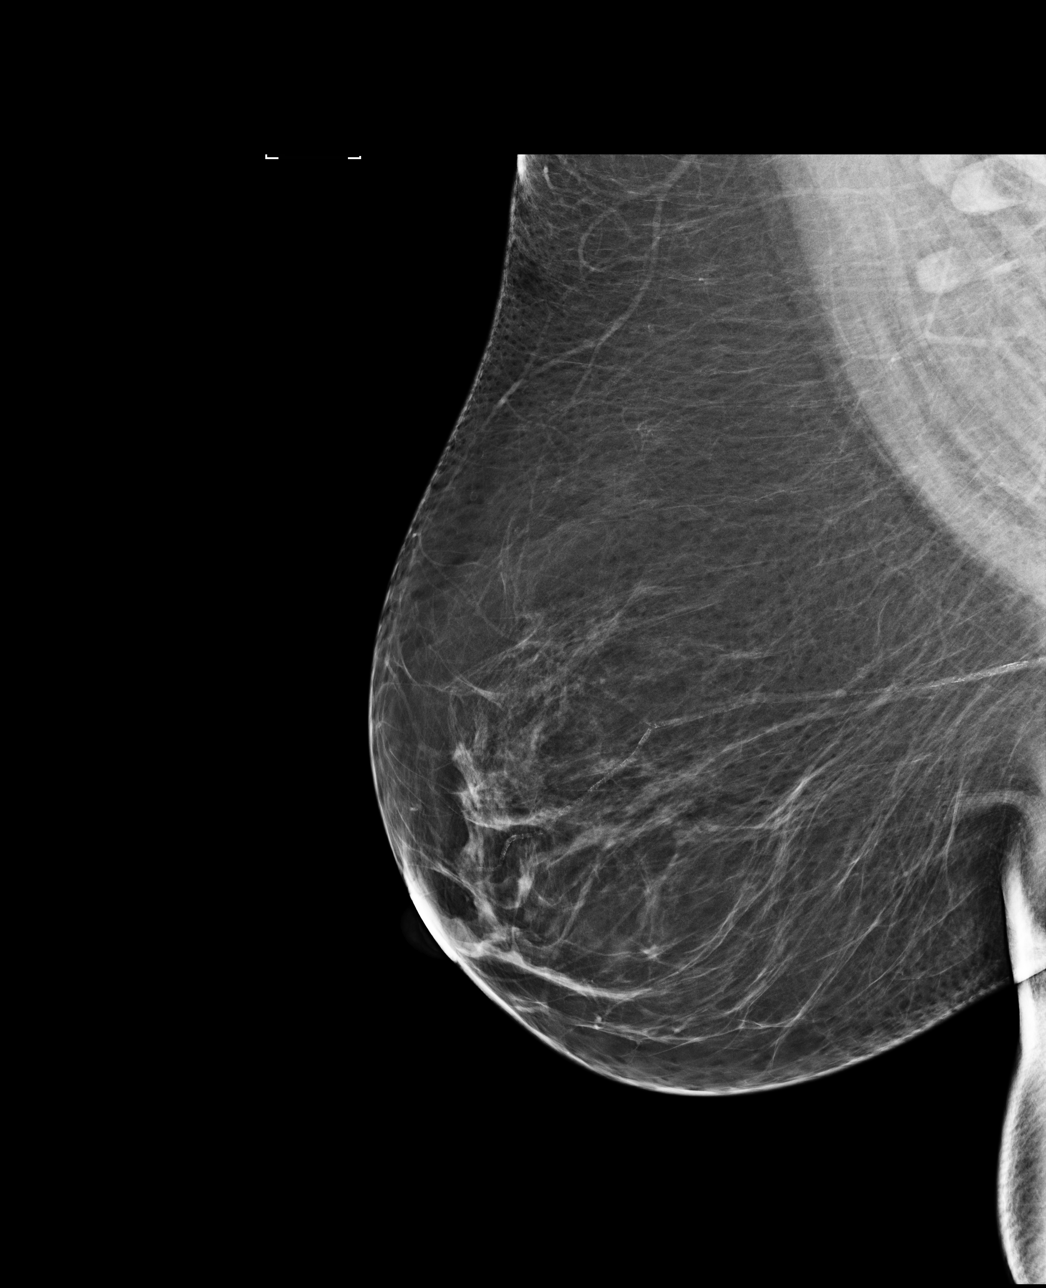

[L MLO]
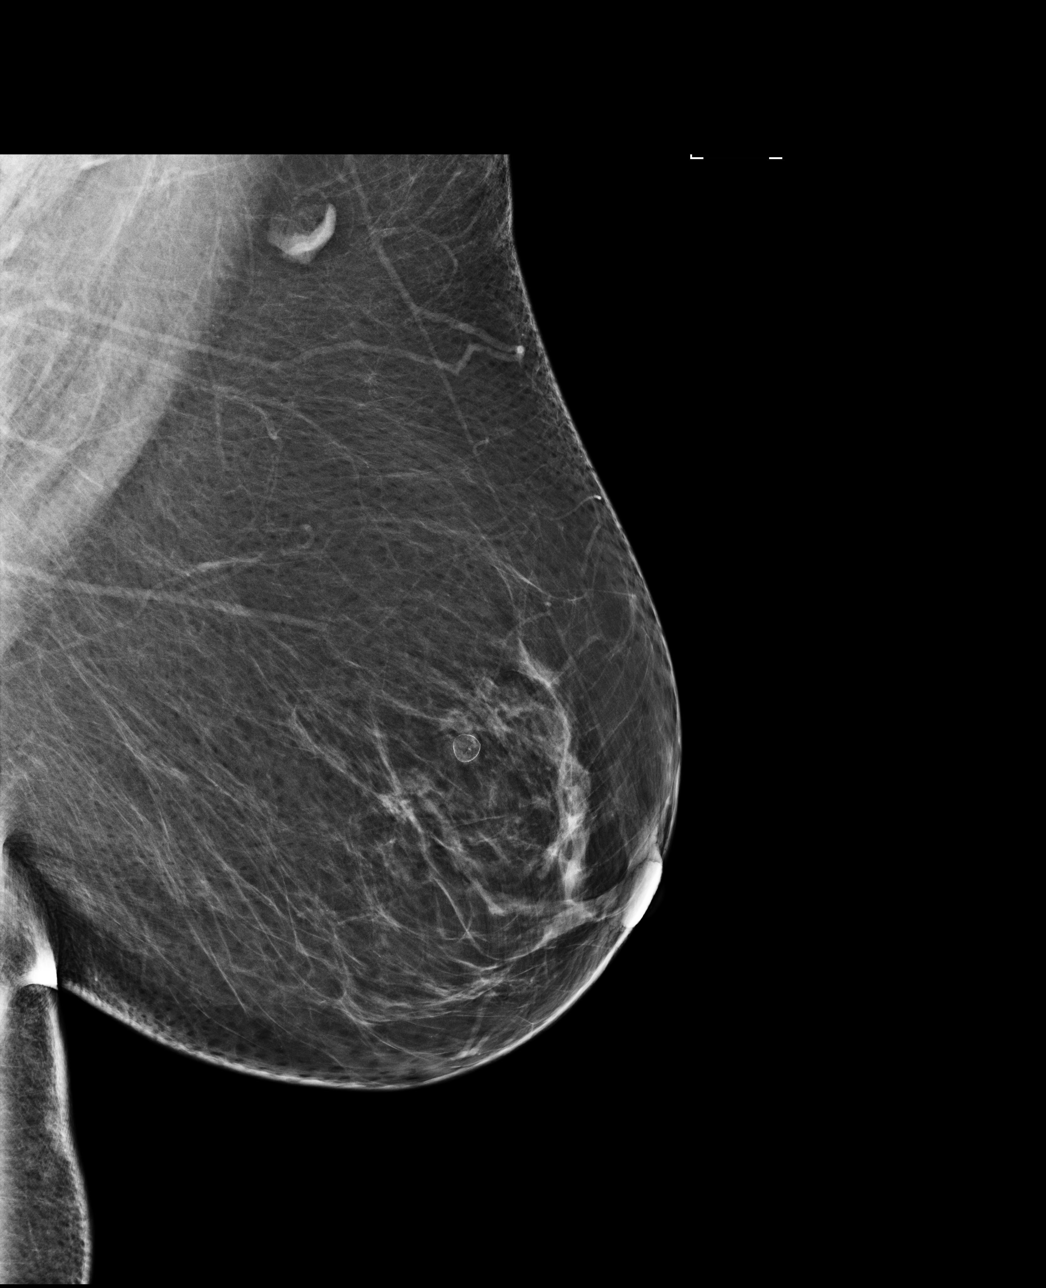

[L CC]
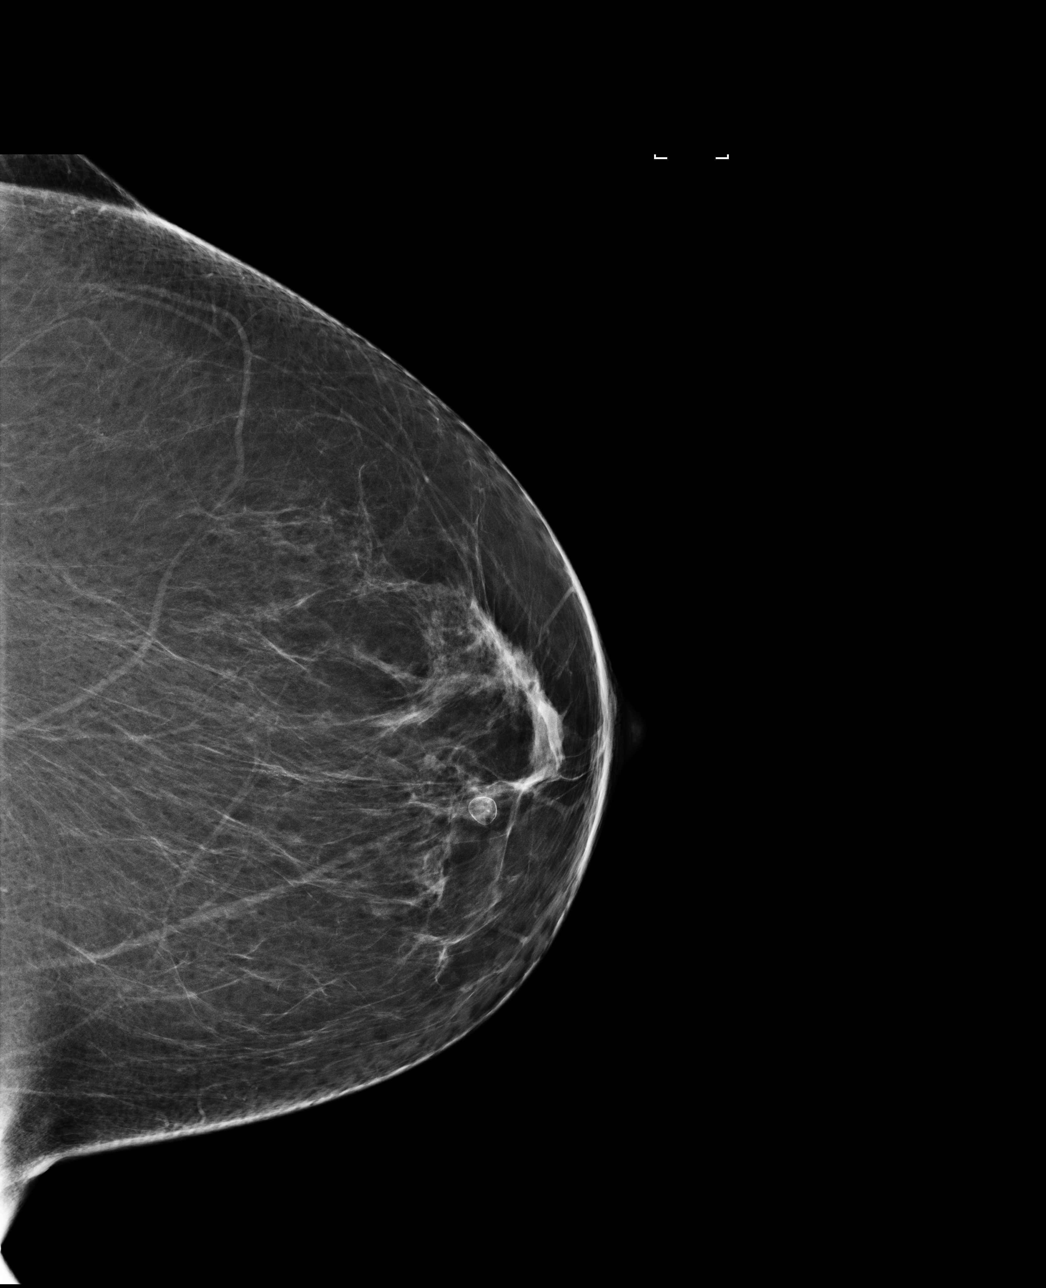

[R CC]
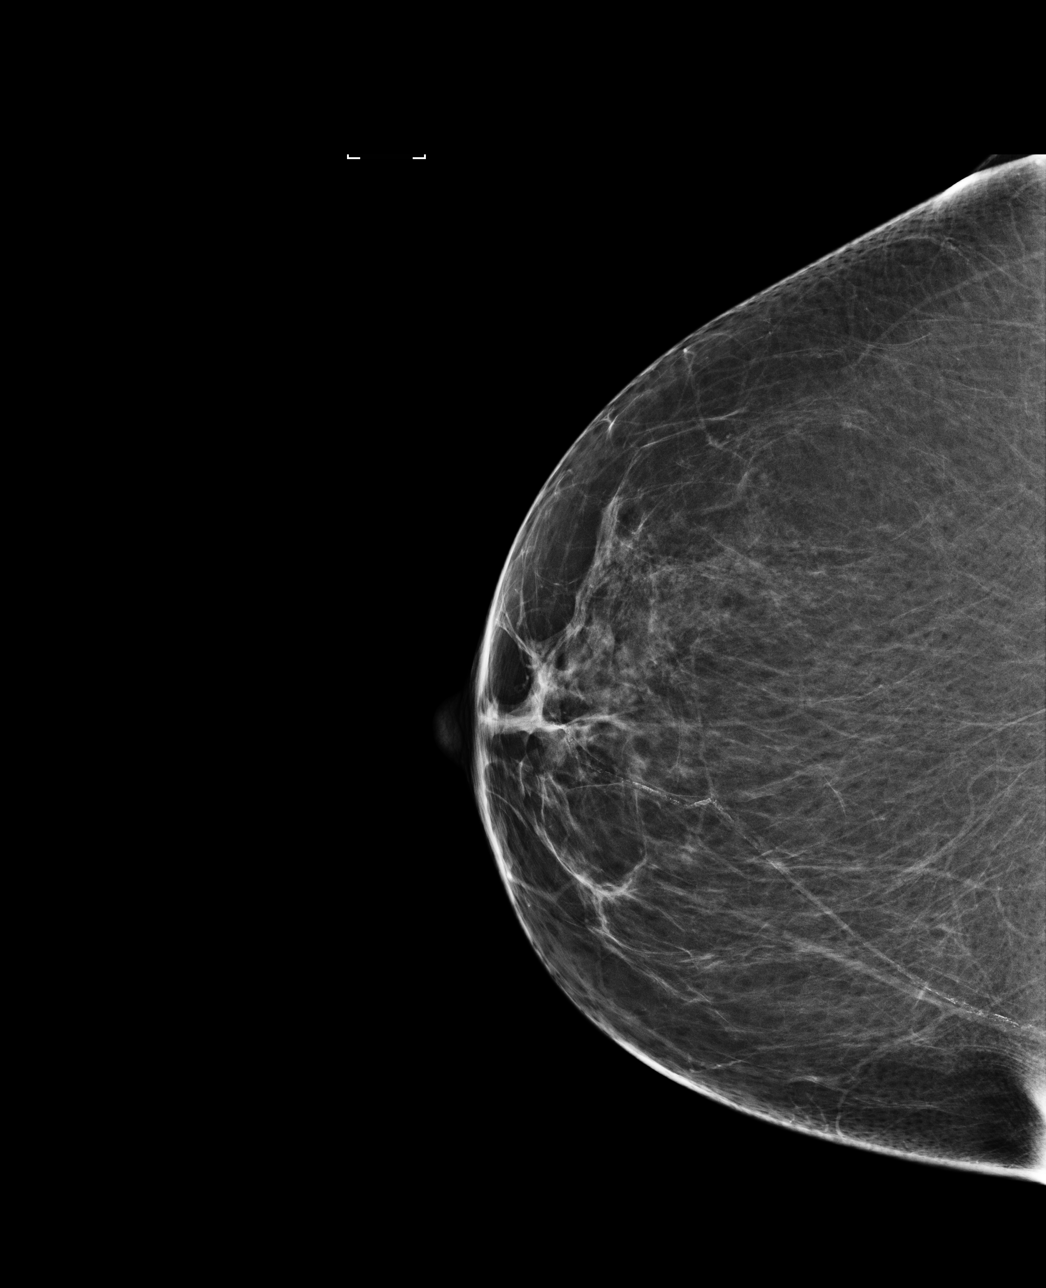

[4 of 4 positions shown; findings below may reference images not displayed]

ACR Breast Density Category b: There are scattered areas of
fibroglandular density.
FINDINGS: There are no findings suspicious for malignancy. Images were
processed with CAD.
IMPRESSION: No mammographic evidence of malignancy. A result letter of this
screening mammogram will be mailed directly to the patient.

RECOMMENDATION:
Screening mammogram in one year. (Code:AS-G-LCT)

BI-RADS CATEGORY  1: Negative.

## 2020-07-14 DIAGNOSIS — Z23 Encounter for immunization: Secondary | ICD-10-CM | POA: Diagnosis not present

## 2020-09-20 ENCOUNTER — Other Ambulatory Visit: Payer: Self-pay | Admitting: Internal Medicine

## 2020-09-20 DIAGNOSIS — Z1231 Encounter for screening mammogram for malignant neoplasm of breast: Secondary | ICD-10-CM

## 2020-10-30 DIAGNOSIS — Z79899 Other long term (current) drug therapy: Secondary | ICD-10-CM | POA: Diagnosis not present

## 2020-10-30 DIAGNOSIS — E785 Hyperlipidemia, unspecified: Secondary | ICD-10-CM | POA: Diagnosis not present

## 2020-10-30 DIAGNOSIS — Z0000 Encounter for general adult medical examination without abnormal findings: Secondary | ICD-10-CM | POA: Diagnosis not present

## 2020-10-30 DIAGNOSIS — F419 Anxiety disorder, unspecified: Secondary | ICD-10-CM | POA: Diagnosis not present

## 2020-10-30 DIAGNOSIS — E559 Vitamin D deficiency, unspecified: Secondary | ICD-10-CM | POA: Diagnosis not present

## 2020-10-30 DIAGNOSIS — E1165 Type 2 diabetes mellitus with hyperglycemia: Secondary | ICD-10-CM | POA: Diagnosis not present

## 2020-10-30 DIAGNOSIS — I10 Essential (primary) hypertension: Secondary | ICD-10-CM | POA: Diagnosis not present

## 2020-10-30 DIAGNOSIS — E669 Obesity, unspecified: Secondary | ICD-10-CM | POA: Diagnosis not present

## 2020-11-16 ENCOUNTER — Other Ambulatory Visit: Payer: Self-pay

## 2020-11-16 ENCOUNTER — Ambulatory Visit
Admission: RE | Admit: 2020-11-16 | Discharge: 2020-11-16 | Disposition: A | Discharge status: Home / Self Care | Payer: BC Managed Care – PPO | Source: Ambulatory Visit | Attending: Internal Medicine | Admitting: Internal Medicine

## 2020-11-16 DIAGNOSIS — Z1231 Encounter for screening mammogram for malignant neoplasm of breast: Secondary | ICD-10-CM | POA: Diagnosis not present
